# Patient Record
Sex: Female | Born: 1950
Health system: Southern US, Community
[De-identification: ages and names within clinical notes are randomized; demographics above are authoritative.]

## PROBLEM LIST (undated history)

## (undated) DIAGNOSIS — Z853 Personal history of malignant neoplasm of breast: Secondary | ICD-10-CM

## (undated) DIAGNOSIS — Z923 Personal history of irradiation: Secondary | ICD-10-CM

## (undated) DIAGNOSIS — K219 Gastro-esophageal reflux disease without esophagitis: Secondary | ICD-10-CM

## (undated) DIAGNOSIS — D0512 Intraductal carcinoma in situ of left breast: Secondary | ICD-10-CM

## (undated) DIAGNOSIS — K759 Inflammatory liver disease, unspecified: Secondary | ICD-10-CM

## (undated) HISTORY — DX: Personal history of malignant neoplasm of breast: Z85.3

## (undated) HISTORY — PX: TUBAL LIGATION: SHX77

## (undated) HISTORY — PX: BREAST BIOPSY: SHX20

## (undated) HISTORY — DX: Gastro-esophageal reflux disease without esophagitis: K21.9

## (undated) HISTORY — PX: PARTIAL COLECTOMY: SHX5273

## (undated) HISTORY — DX: Inflammatory liver disease, unspecified: K75.9

## (undated) HISTORY — DX: Intraductal carcinoma in situ of left breast: D05.12

## (undated) HISTORY — PX: CHOLECYSTECTOMY: SHX55

---

## 1996-02-19 DIAGNOSIS — C50919 Malignant neoplasm of unspecified site of unspecified female breast: Secondary | ICD-10-CM

## 1996-02-19 HISTORY — PX: MASTECTOMY: SHX3

## 1996-02-19 HISTORY — DX: Malignant neoplasm of unspecified site of unspecified female breast: C50.919

## 1999-03-19 ENCOUNTER — Encounter: Admission: RE | Admit: 1999-03-19 | Discharge: 1999-03-19 | Payer: Self-pay | Admitting: Obstetrics and Gynecology

## 1999-03-19 ENCOUNTER — Encounter: Payer: Self-pay | Admitting: Obstetrics and Gynecology

## 1999-08-07 ENCOUNTER — Other Ambulatory Visit: Admission: RE | Admit: 1999-08-07 | Discharge: 1999-08-07 | Payer: Self-pay | Admitting: Plastic Surgery

## 2000-03-19 ENCOUNTER — Encounter: Admission: RE | Admit: 2000-03-19 | Discharge: 2000-03-19 | Payer: Self-pay | Admitting: Obstetrics and Gynecology

## 2000-03-19 ENCOUNTER — Encounter: Payer: Self-pay | Admitting: Obstetrics and Gynecology

## 2000-05-10 ENCOUNTER — Emergency Department (HOSPITAL_COMMUNITY): Admission: EM | Admit: 2000-05-10 | Discharge: 2000-05-10 | Payer: Self-pay | Admitting: Emergency Medicine

## 2001-04-27 ENCOUNTER — Encounter: Payer: Self-pay | Admitting: Obstetrics and Gynecology

## 2001-04-27 ENCOUNTER — Encounter: Admission: RE | Admit: 2001-04-27 | Discharge: 2001-04-27 | Payer: Self-pay | Admitting: Obstetrics and Gynecology

## 2001-09-29 ENCOUNTER — Encounter: Payer: Self-pay | Admitting: Plastic Surgery

## 2001-09-29 ENCOUNTER — Encounter: Admission: RE | Admit: 2001-09-29 | Discharge: 2001-09-29 | Payer: Self-pay | Admitting: Plastic Surgery

## 2003-01-31 ENCOUNTER — Encounter: Admission: RE | Admit: 2003-01-31 | Discharge: 2003-01-31 | Payer: Self-pay | Admitting: Obstetrics and Gynecology

## 2003-02-10 ENCOUNTER — Encounter: Admission: RE | Admit: 2003-02-10 | Discharge: 2003-02-10 | Payer: Self-pay | Admitting: Obstetrics and Gynecology

## 2003-03-03 ENCOUNTER — Encounter (INDEPENDENT_AMBULATORY_CARE_PROVIDER_SITE_OTHER): Payer: Self-pay | Admitting: *Deleted

## 2003-03-03 ENCOUNTER — Encounter: Admission: RE | Admit: 2003-03-03 | Discharge: 2003-03-03 | Payer: Self-pay | Admitting: General Surgery

## 2003-08-25 ENCOUNTER — Encounter: Admission: RE | Admit: 2003-08-25 | Discharge: 2003-08-25 | Payer: Self-pay | Admitting: Obstetrics and Gynecology

## 2004-02-10 ENCOUNTER — Encounter: Admission: RE | Admit: 2004-02-10 | Discharge: 2004-02-10 | Payer: Self-pay | Admitting: Obstetrics and Gynecology

## 2004-05-23 ENCOUNTER — Encounter: Admission: RE | Admit: 2004-05-23 | Discharge: 2004-05-23 | Payer: Self-pay | Admitting: Obstetrics and Gynecology

## 2004-05-24 ENCOUNTER — Encounter: Admission: RE | Admit: 2004-05-24 | Discharge: 2004-05-24 | Payer: Self-pay | Admitting: Obstetrics and Gynecology

## 2004-06-27 ENCOUNTER — Encounter (INDEPENDENT_AMBULATORY_CARE_PROVIDER_SITE_OTHER): Payer: Self-pay | Admitting: Specialist

## 2004-06-27 ENCOUNTER — Ambulatory Visit (HOSPITAL_COMMUNITY): Admission: RE | Admit: 2004-06-27 | Discharge: 2004-06-27 | Payer: Self-pay | Admitting: Gastroenterology

## 2004-07-24 ENCOUNTER — Encounter (INDEPENDENT_AMBULATORY_CARE_PROVIDER_SITE_OTHER): Payer: Self-pay | Admitting: Specialist

## 2004-07-24 ENCOUNTER — Inpatient Hospital Stay (HOSPITAL_COMMUNITY): Admission: RE | Admit: 2004-07-24 | Discharge: 2004-07-27 | Payer: Self-pay | Admitting: General Surgery

## 2004-11-16 ENCOUNTER — Encounter: Admission: RE | Admit: 2004-11-16 | Discharge: 2004-11-16 | Payer: Self-pay | Admitting: Obstetrics and Gynecology

## 2005-04-25 ENCOUNTER — Encounter: Admission: RE | Admit: 2005-04-25 | Discharge: 2005-04-25 | Payer: Self-pay | Admitting: Obstetrics and Gynecology

## 2005-07-29 ENCOUNTER — Encounter (INDEPENDENT_AMBULATORY_CARE_PROVIDER_SITE_OTHER): Payer: Self-pay | Admitting: Specialist

## 2005-07-29 ENCOUNTER — Ambulatory Visit (HOSPITAL_COMMUNITY): Admission: RE | Admit: 2005-07-29 | Discharge: 2005-07-29 | Payer: Self-pay | Admitting: Gastroenterology

## 2006-04-28 ENCOUNTER — Encounter: Admission: RE | Admit: 2006-04-28 | Discharge: 2006-04-28 | Payer: Self-pay | Admitting: Obstetrics and Gynecology

## 2007-04-28 ENCOUNTER — Encounter: Admission: RE | Admit: 2007-04-28 | Discharge: 2007-04-28 | Payer: Self-pay | Admitting: Obstetrics and Gynecology

## 2007-04-29 ENCOUNTER — Encounter: Admission: RE | Admit: 2007-04-29 | Discharge: 2007-04-29 | Payer: Self-pay | Admitting: Obstetrics and Gynecology

## 2008-04-29 ENCOUNTER — Encounter: Admission: RE | Admit: 2008-04-29 | Discharge: 2008-04-29 | Payer: Self-pay | Admitting: Obstetrics and Gynecology

## 2008-07-12 ENCOUNTER — Encounter: Admission: RE | Admit: 2008-07-12 | Discharge: 2008-07-12 | Payer: Self-pay | Admitting: Family Medicine

## 2009-05-22 ENCOUNTER — Encounter: Admission: RE | Admit: 2009-05-22 | Discharge: 2009-05-22 | Payer: Self-pay | Admitting: Obstetrics and Gynecology

## 2010-03-11 ENCOUNTER — Encounter: Payer: Self-pay | Admitting: Gastroenterology

## 2010-06-15 ENCOUNTER — Other Ambulatory Visit: Payer: Self-pay | Admitting: Family Medicine

## 2010-06-15 DIAGNOSIS — Z1231 Encounter for screening mammogram for malignant neoplasm of breast: Secondary | ICD-10-CM

## 2010-06-25 ENCOUNTER — Ambulatory Visit
Admission: RE | Admit: 2010-06-25 | Discharge: 2010-06-25 | Disposition: A | Discharge status: Home / Self Care | Payer: BC Managed Care – PPO | Source: Ambulatory Visit | Attending: Family Medicine | Admitting: Family Medicine

## 2010-06-25 DIAGNOSIS — Z1231 Encounter for screening mammogram for malignant neoplasm of breast: Secondary | ICD-10-CM

## 2010-07-06 NOTE — Discharge Summary (Signed)
[name], Ariel Johnson             ACCOUNT NO.:  [id number]   MEDICAL RECORD NO.:  [id number]          PATIENT TYPE:  INP   LOCATION:  1613                         FACILITY:  [facility]   PHYSICIAN:  Sharlet Salina T. Hoxworth, M.D.DATE OF BIRTH:  [birth year]   DATE OF ADMISSION:  07/24/2004  DATE OF DISCHARGE:  07/27/2004                                 DISCHARGE SUMMARY   DISCHARGE DIAGNOSIS:  Tubulovillous adenoma, right colon.   OPERATION PERFORMED:  Laparoscopic right colectomy, July 24, 2004.   HISTORY OF PRESENT ILLNESS:  Ariel Johnson is a 60 year old female who recently  underwent screening colonoscopy with Dr. Charna Elizabeth, which revealed a  large, flat cecal mass.  Biopsies showed the fragments of tubulovillous  adenoma.  She has no GI symptoms.  After discussion of options, we elected  to proceed with laparoscopic right colectomy.  She is admitted for this  procedure following mechanical antibiotic bowel prep at home.   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  She had an open cholecystectomy in 1988.  She is followed for a suddenly abnormal mammogram with MRI of the right  breast with a six month followup planned.   CURRENT MEDICATIONS:  None.   ALLERGIES:  SULFA antibiotics.   SOCIAL HISTORY:  See the H&P.   FAMILY HISTORY:  See the H&P.   REVIEW OF SYSTEMS:  See the H&P.   PERTINENT PHYSICAL EXAMINATION:  VITAL SIGNS:  Afebrile.  Vital signs within  normal limits.  GENERAL:  A healthy-appearing female in no acute distress.  ABDOMEN:  Physical examination was unremarkable with well-healed abdominal  incision secondary to her tram flap and cholecystectomy.   HOSPITAL COURSE:  Patient was admitted on the morning of her procedure and  underwent an uneventful microscopic-assisted right hemicolectomy.  She had  some low-grade fever on the first postoperative day that improved with a  pulmonary toilet.  She was  started on clear liquids on the second  postoperative day and had some flatus and small bowel movements.  On the  third postoperative day, she was afebrile with normal vital signs.  She was  tolerating diet well.  Abdomen was soft and nontender.  White count was 5000  with hemoglobin of 10.3.  At this point, she is discharged home on the third  postop day.  Pathology showed tubulovillous adenoma with no evidence of  malignancy.   Followup is to be in my office in one week.   DISCHARGE MEDICATIONS:  Same as on admission plus Vicodin or Tylenol p.r.n.  pain.       BTH/MEDQ  D:  08/13/2004  T:  08/13/2004  Job:  [id number]   cc:   Anselmo Rod, M.D.  [address]  [address]  [place]  [place] [address]  Fax: [phone]   Evelena Peat, M.D.  [address]  [place]  [place] [address]  Fax: [phone]

## 2010-07-06 NOTE — Op Note (Signed)
NAMEHOORIA, GASPARINI             ACCOUNT NO.:  [id number]   MEDICAL RECORD NO.:  [id number]          PATIENT TYPE:  AMB   LOCATION:  ENDO                         FACILITY:  [facility]   PHYSICIAN:  Anselmo Rod, M.D.  DATE OF BIRTH:  08/25/1950   DATE OF PROCEDURE:  07/29/2005  DATE OF DISCHARGE:                                 OPERATIVE REPORT   PROCEDURE PERFORMED:  Colonoscopy with multiple cold biopsies.   ENDOSCOPIST:  Anselmo Rod, M.D.   INSTRUMENT USED:  Olympus video colonoscope.   INDICATIONS FOR PROCEDURE:  A 60 year old white female with a personal  history of breast cancer, status post right hemicolectomy for a large  tubulovillous adenoma, undergoing a repeat colonoscopy for surveillance  __________ recurrent polyps, masses, etc.   PRE-PROCEDURE PREPARATION:  Informed consent was procured from the patient.  The patient was fasted for four hours prior to the procedure and prepped  with OsmoPrep over the night and in the morning of the procedure.  Risks and  benefits of the procedure, including a 10% missed rate of cancer and polyps,  were discussed with the patient as well.   PRE-PROCEDURE PHYSICAL EXAMINATION:  VITAL SIGNS:  Stable vital signs.  NECK:  Supple.  CHEST:  Clear to auscultation.  HEART:  S1 and S2.  Regular.  ABDOMEN:  Soft with normal bowel sounds.   DESCRIPTION OF PROCEDURE:  The patient was placed in the left lateral  decubitus position and sedated with 75 mcg of fentanyl and 7 mg of Versed in  slow, incremental doses.  Once the patient was adequately sedated,  maintained on low flow oxygen, and continuous cardiac monitoring, the  Olympus video colonoscope was advanced from the rectum to the anastomosis  without difficulty.  Four small sessile polyps were biopsied from the  rectosigmoid colon.  Patient is status post right hemicolectomy.  Healthy  anastomosis was noted.  With retroflexion, the rectum revealed small  internal hemorrhoids.  The  patient tolerated the procedure well without  complications.   IMPRESSION:  1.Small nonbleeding internal hemorrhoid.  2.Healthy anastomosis, status post right hemicolectomy.  3.Four small sessile polyps were biopsied from the rectosigmoid colon.   RECOMMENDATIONS:  1.Await pathology results.  2.Avoid all nonsteroidals, including aspirin for the next two weeks.  3.Repeat colonoscopy depending on pathology results.  3.Outpatient followup as the needs arise in the future.      Anselmo Rod, M.D.  Electronically Signed     JNM/MEDQ  D:  07/29/2005  T:  07/29/2005  Job:  [id number]   cc:   Evelena Peat, M.D.  Fax: [phone]   Lorne Skeens. Hoxworth, M.D.  [address], [address]  [place]  [place] [address]

## 2010-07-06 NOTE — Op Note (Signed)
[name], [name]             ACCOUNT NO.:  [id number]   MEDICAL RECORD NO.:  [id number]          PATIENT TYPE:  INP   LOCATION:  0001                         FACILITY:  [facility]   PHYSICIAN:  Ariel Johnson, M.D.DATE OF BIRTH:  Feb 13, 1951   DATE OF PROCEDURE:  07/24/2004  DATE OF DISCHARGE:                                 OPERATIVE REPORT   PREOPERATIVE DIAGNOSES:  Villous adenoma of the cecum.   POSTOPERATIVE DIAGNOSES:  Villous adenoma of the cecum.   SURGICAL PROCEDURES:  Laparoscopic assisted right hemicolectomy.   SURGEON:  Ariel Johnson. Johnson, M.D.   ASSISTANT:  Ariel Johnson, M.D.   ANESTHESIA:  General.   BRIEF HISTORY:  Ariel Johnson is a 60 year old female with a personal  history of breast cancer status post left mastectomy followed by TRAM flap  reconstruction. She recently underwent a routine screening colonoscopy and  was found to have a fairly large sessile adenomatous appearing lesion in the  cecum. Biopsy has revealed villous adenoma without atypia. Right  hemicolectomy has been recommended for treatment. A laparoscopic approach  has been discussed and accepted. The nature of the procedure, its  indications, risks of bleeding, infection and anastomotic leak and possible  need for open surgery were discussed and understood. She is now brought to  the operating room for this procedure.   DESCRIPTION OF PROCEDURE:  The patient was brought to the operating room and  placed in supine position on the operating table and general endotracheal  anesthesia was induced. The abdomen was widely sterilely prepped and draped.  Foley catheter was placed. She received IV preoperative antibiotics. She had  undergone a mechanical antibiotic bowel prep at home. The previous  periumbilical incision for the TRAM flap was used and dissection carried  down through the subcutaneous tissue. The mesh was sharply incised for about  a centimeter, the posterior peritoneum  elevated and the free peritoneal  cavity entered under direct vision. Through a mattress suture of #0 Vicryl,  the Hasson trocar was placed and pneumoperitoneum established. Video  laparoscopy revealed fortunately minimal adhesions from a previous  cholecystectomy. Under direct vision, a 5 mm trocar was placed in the  suprapubic area and in the epigastrium. The cecum was already quite mobile.  The terminal ileum was mobile. The bowel, liver, and retroperitoneum all  appeared normal. The right colon was then extensively mobilized dividing the  peritoneum along the line of Toldt with scissor and harmonic scalpel  dissection. There were a few adhesions to the right upper quadrant  cholecystectomy incision which were taken down. The right colon was  mobilized up toward the hepatic flexure. The liver was retracted with an  additional 5 mm trocar placed in the right lower quadrant through her  previous TRAM incision. The hepatic flexure was mobilized dividing the  hepatocolic ligament with the harmonic scalpel and the colon was mobilized  to the proximal transverse colon. Dissection was then carried back  proximally along the right colon further mobilizing the colon out of the  retroperitoneum. The right ureter was identified and carefully protected. A  few further peritoneal attachments to  the cecum were divided which was very  mobile and at this point the entire right colon was very well mobilized. At  this point, a 6 cm incision was made through her old TRAM incision in the  right mid abdomen incorporating the one trocar site and dissection carried  down through the fascial muscle layers with cautery. A wound protractor was  placed. The right colon was brought up through the small incision and was  very mobile and was brought up to the proximal transverse colon without any  difficulty exposing the mesentery. Points of resection at the terminal ileum  and proximal transverse colon were chosen.  The mesentery of the segment to  be resected was then sequentially dived between clamps and tied with 2-0  silk ties. The ileocolic and right colic vessels were taken near the origin  and additionally suture ligated. After complete division of the mesentery, a  functional end-to-end anastomosis was created with a single firing of the  GIA 75 mm stapler. The staple line was intact and without bleeding. The  common enterotomy was closed and the specimen was removed with a single  firing of TA 60 stapler. The specimen was opened in the operating room and  the lesion was seen to be within the cecum as described. The viscera  returned to the abdomen. The right lower quadrant decision was closed in two  layers with running 0 and #1 PDS. Following this, the pneumoperitoneum was  reestablished and the laparoscopy showed no evidence of bleeding. The  abdomen was copiously irrigated with saline. The mesenteric defect had been  closed with an interrupted 2-0 silk sutures prior to placing the colon back  in the abdomen. After hemostasis was assured and the abdomen was well  irrigated, trocars were removed and all CO2 evacuated. Skin incisions were  closed with staples. Sponge, needle and instrument counts were correct. Dry  sterile dressings were applied and the patient taken to recovery in good  condition.      BTH/MEDQ  D:  07/24/2004  T:  07/24/2004  Job:  [id number]

## 2010-07-06 NOTE — Op Note (Signed)
Ariel Johnson, Ariel Johnson             ACCOUNT NO.:  [id number]   MEDICAL RECORD NO.:  [id number]          PATIENT TYPE:  AMB   LOCATION:  ENDO                         FACILITY:  [facility]   PHYSICIAN:  Anselmo Rod, M.D.  DATE OF BIRTH:  [birth year]   DATE OF PROCEDURE:  06/27/2004  DATE OF DISCHARGE:                                 OPERATIVE REPORT   PROCEDURE PERFORMED:  Colonoscopy with multiple cold biopsies.   ENDOSCOPIST:  Charna Elizabeth, M.D.   INSTRUMENT USED:  Olympus video colonoscope.   INDICATIONS FOR PROCEDURE:  The patient is a 60 year old white female with a  personal history of breast cancer diagnosed and treated in 1998 and a family  history of colon cancer (father had a partial colectomy for cancerous  polyp).  Undergoing screening colonoscopy. The patient has a history of  occasional constipation and at times rectal bleeding.   PREPROCEDURE PREPARATION:  Informed consent was procured from the patient.  The patient was fasted for eight hours prior to the procedure and prepped  with a bottle of magnesium citrate and a gallon of GoLYTELY the night prior  to the procedure.  The risks and benefits of the procedure including a 10%  miss rate for polyps or cancers was discussed with the patient as well.   PREPROCEDURE PHYSICAL:  The patient had stable vital signs.  Neck supple.  Chest clear to auscultation.  S1 and S2 regular.  Abdomen soft with normal  bowel sounds.   DESCRIPTION OF PROCEDURE:  The patient was placed in left lateral decubitus  position and sedated with 70 mg of Demerol and 7 mg of Versed in slow  incremental doses.  Once the patient was adequately sedated and maintained  on low flow oxygen and continuous cardiac monitoring, the Olympus video  colonoscope was advanced from the rectum to the cecum.  Two small sessile  polyps were biopsied from the cecal base. A large flat lesion was seen in  the cecum, opposite the ileocecal valve resembling a villous  adenoma.  Multiple biopsies were done as the lesion could not be completely excised.  No attempt was made to do so.  Small internal hemorrhoids were seen on  retroflexion.  The patient tolerated the procedure well without  complication.   IMPRESSION:  1.  Flat large cecal mass noted.  Multiple biopsies done.  2.  Two small sessile polyps biopsied from the cecal base.  3.  Small internal hemorrhoids.   RECOMMENDATIONS:  1.  Await pathology results.  2.  Avoid all nonsteroidals including aspirin for now.  3.  Check CEA levels.  4.  CT scan of the abdomen and pelvis today.  5.  Surgical evaluation with Sharlet Salina T. Hoxworth, M.D.  6.  Outpatient followup in the next two weeks or earlier if need be.      JNM/MEDQ  D:  06/27/2004  T:  06/28/2004  Job:  [id number]   cc:   Evelena Peat, M.D.  [address]  [place]  [place] [address]  Fax: [phone]   Lorne Skeens. Hoxworth, M.D.  [phone]  [place]  [place] [address]

## 2011-07-08 ENCOUNTER — Other Ambulatory Visit: Payer: Self-pay | Admitting: Internal Medicine

## 2011-07-08 DIAGNOSIS — Z1231 Encounter for screening mammogram for malignant neoplasm of breast: Secondary | ICD-10-CM

## 2011-07-25 ENCOUNTER — Ambulatory Visit
Admission: RE | Admit: 2011-07-25 | Discharge: 2011-07-25 | Disposition: A | Discharge status: Home / Self Care | Payer: BC Managed Care – PPO | Source: Ambulatory Visit | Attending: Internal Medicine | Admitting: Internal Medicine

## 2011-07-25 DIAGNOSIS — Z1231 Encounter for screening mammogram for malignant neoplasm of breast: Secondary | ICD-10-CM

## 2011-08-01 ENCOUNTER — Other Ambulatory Visit: Payer: Self-pay | Admitting: Internal Medicine

## 2011-08-01 DIAGNOSIS — R928 Other abnormal and inconclusive findings on diagnostic imaging of breast: Secondary | ICD-10-CM

## 2011-08-06 ENCOUNTER — Ambulatory Visit
Admission: RE | Admit: 2011-08-06 | Discharge: 2011-08-06 | Disposition: A | Discharge status: Home / Self Care | Payer: BC Managed Care – PPO | Source: Ambulatory Visit | Attending: Internal Medicine | Admitting: Internal Medicine

## 2011-08-06 ENCOUNTER — Other Ambulatory Visit: Payer: Self-pay | Admitting: Internal Medicine

## 2011-08-06 DIAGNOSIS — R928 Other abnormal and inconclusive findings on diagnostic imaging of breast: Secondary | ICD-10-CM

## 2011-08-06 DIAGNOSIS — R921 Mammographic calcification found on diagnostic imaging of breast: Secondary | ICD-10-CM

## 2011-08-08 ENCOUNTER — Ambulatory Visit
Admission: RE | Admit: 2011-08-08 | Discharge: 2011-08-08 | Disposition: A | Discharge status: Home / Self Care | Payer: BC Managed Care – PPO | Source: Ambulatory Visit | Attending: Internal Medicine | Admitting: Internal Medicine

## 2011-08-08 DIAGNOSIS — R921 Mammographic calcification found on diagnostic imaging of breast: Secondary | ICD-10-CM

## 2011-09-27 ENCOUNTER — Other Ambulatory Visit: Payer: Self-pay | Admitting: Dermatology

## 2012-06-25 ENCOUNTER — Other Ambulatory Visit: Payer: Self-pay

## 2012-06-25 DIAGNOSIS — Z9012 Acquired absence of left breast and nipple: Secondary | ICD-10-CM

## 2012-06-25 DIAGNOSIS — Z1231 Encounter for screening mammogram for malignant neoplasm of breast: Secondary | ICD-10-CM

## 2012-07-29 ENCOUNTER — Ambulatory Visit
Admission: RE | Admit: 2012-07-29 | Discharge: 2012-07-29 | Disposition: A | Discharge status: Home / Self Care | Payer: BC Managed Care – PPO | Source: Ambulatory Visit

## 2012-07-29 DIAGNOSIS — Z1231 Encounter for screening mammogram for malignant neoplasm of breast: Secondary | ICD-10-CM

## 2012-07-29 DIAGNOSIS — Z9012 Acquired absence of left breast and nipple: Secondary | ICD-10-CM

## 2012-12-24 ENCOUNTER — Other Ambulatory Visit (HOSPITAL_COMMUNITY)
Admission: RE | Admit: 2012-12-24 | Discharge: 2012-12-24 | Disposition: A | Discharge status: Home / Self Care | Payer: BC Managed Care – PPO | Source: Ambulatory Visit | Attending: Internal Medicine | Admitting: Internal Medicine

## 2012-12-24 ENCOUNTER — Other Ambulatory Visit: Payer: Self-pay | Admitting: Internal Medicine

## 2012-12-24 DIAGNOSIS — Z1151 Encounter for screening for human papillomavirus (HPV): Secondary | ICD-10-CM | POA: Insufficient documentation

## 2012-12-24 DIAGNOSIS — Z01419 Encounter for gynecological examination (general) (routine) without abnormal findings: Secondary | ICD-10-CM | POA: Insufficient documentation

## 2013-07-08 ENCOUNTER — Other Ambulatory Visit: Payer: Self-pay

## 2013-07-08 DIAGNOSIS — Z1231 Encounter for screening mammogram for malignant neoplasm of breast: Secondary | ICD-10-CM

## 2013-07-08 DIAGNOSIS — Z9012 Acquired absence of left breast and nipple: Secondary | ICD-10-CM

## 2013-08-02 ENCOUNTER — Ambulatory Visit: Payer: BC Managed Care – PPO

## 2013-08-19 ENCOUNTER — Ambulatory Visit
Admission: RE | Admit: 2013-08-19 | Discharge: 2013-08-19 | Disposition: A | Discharge status: Home / Self Care | Payer: BC Managed Care – PPO | Source: Ambulatory Visit

## 2013-08-19 DIAGNOSIS — Z1231 Encounter for screening mammogram for malignant neoplasm of breast: Secondary | ICD-10-CM

## 2013-08-19 DIAGNOSIS — Z9012 Acquired absence of left breast and nipple: Secondary | ICD-10-CM

## 2014-03-18 ENCOUNTER — Other Ambulatory Visit: Payer: Self-pay | Admitting: Internal Medicine

## 2014-03-18 DIAGNOSIS — N63 Unspecified lump in unspecified breast: Secondary | ICD-10-CM

## 2014-03-25 ENCOUNTER — Ambulatory Visit
Admission: RE | Admit: 2014-03-25 | Discharge: 2014-03-25 | Disposition: A | Discharge status: Home / Self Care | Payer: Self-pay | Source: Ambulatory Visit | Attending: Internal Medicine | Admitting: Internal Medicine

## 2014-03-25 DIAGNOSIS — N63 Unspecified lump in unspecified breast: Secondary | ICD-10-CM

## 2014-08-02 ENCOUNTER — Other Ambulatory Visit: Payer: Self-pay

## 2014-08-02 DIAGNOSIS — Z1231 Encounter for screening mammogram for malignant neoplasm of breast: Secondary | ICD-10-CM

## 2014-08-02 DIAGNOSIS — Z9012 Acquired absence of left breast and nipple: Secondary | ICD-10-CM

## 2014-09-09 ENCOUNTER — Ambulatory Visit
Admission: RE | Admit: 2014-09-09 | Discharge: 2014-09-09 | Disposition: A | Discharge status: Home / Self Care | Payer: BLUE CROSS/BLUE SHIELD | Source: Ambulatory Visit

## 2014-09-09 DIAGNOSIS — Z9012 Acquired absence of left breast and nipple: Secondary | ICD-10-CM

## 2014-09-09 DIAGNOSIS — Z1231 Encounter for screening mammogram for malignant neoplasm of breast: Secondary | ICD-10-CM

## 2015-08-29 ENCOUNTER — Other Ambulatory Visit: Payer: Self-pay | Admitting: Internal Medicine

## 2015-08-29 DIAGNOSIS — N6320 Unspecified lump in the left breast, unspecified quadrant: Secondary | ICD-10-CM

## 2015-08-29 DIAGNOSIS — Z9012 Acquired absence of left breast and nipple: Secondary | ICD-10-CM

## 2015-09-04 ENCOUNTER — Other Ambulatory Visit: Payer: Self-pay | Admitting: Internal Medicine

## 2015-09-04 DIAGNOSIS — Z9012 Acquired absence of left breast and nipple: Secondary | ICD-10-CM

## 2015-09-11 ENCOUNTER — Ambulatory Visit
Admission: RE | Admit: 2015-09-11 | Discharge: 2015-09-11 | Disposition: A | Discharge status: Home / Self Care | Payer: BLUE CROSS/BLUE SHIELD | Source: Ambulatory Visit | Attending: Internal Medicine | Admitting: Internal Medicine

## 2015-09-11 DIAGNOSIS — Z9012 Acquired absence of left breast and nipple: Secondary | ICD-10-CM

## 2015-12-26 DIAGNOSIS — L308 Other specified dermatitis: Secondary | ICD-10-CM | POA: Diagnosis not present

## 2015-12-26 DIAGNOSIS — L821 Other seborrheic keratosis: Secondary | ICD-10-CM | POA: Diagnosis not present

## 2015-12-26 DIAGNOSIS — D1801 Hemangioma of skin and subcutaneous tissue: Secondary | ICD-10-CM | POA: Diagnosis not present

## 2015-12-26 DIAGNOSIS — D225 Melanocytic nevi of trunk: Secondary | ICD-10-CM | POA: Diagnosis not present

## 2015-12-26 DIAGNOSIS — I788 Other diseases of capillaries: Secondary | ICD-10-CM | POA: Diagnosis not present

## 2015-12-26 DIAGNOSIS — D2272 Melanocytic nevi of left lower limb, including hip: Secondary | ICD-10-CM | POA: Diagnosis not present

## 2016-01-01 ENCOUNTER — Other Ambulatory Visit (HOSPITAL_COMMUNITY)
Admission: RE | Admit: 2016-01-01 | Discharge: 2016-01-01 | Disposition: A | Discharge status: Home / Self Care | Payer: PPO | Source: Ambulatory Visit | Attending: Internal Medicine | Admitting: Internal Medicine

## 2016-01-01 ENCOUNTER — Other Ambulatory Visit: Payer: Self-pay | Admitting: Internal Medicine

## 2016-01-01 DIAGNOSIS — K219 Gastro-esophageal reflux disease without esophagitis: Secondary | ICD-10-CM | POA: Diagnosis not present

## 2016-01-01 DIAGNOSIS — Z01419 Encounter for gynecological examination (general) (routine) without abnormal findings: Secondary | ICD-10-CM | POA: Insufficient documentation

## 2016-01-01 DIAGNOSIS — Z1151 Encounter for screening for human papillomavirus (HPV): Secondary | ICD-10-CM | POA: Insufficient documentation

## 2016-01-01 DIAGNOSIS — Z1389 Encounter for screening for other disorder: Secondary | ICD-10-CM | POA: Diagnosis not present

## 2016-01-01 DIAGNOSIS — Z0000 Encounter for general adult medical examination without abnormal findings: Secondary | ICD-10-CM | POA: Diagnosis not present

## 2016-01-04 LAB — CYTOLOGY - PAP
DIAGNOSIS: NEGATIVE
HPV: NOT DETECTED

## 2016-03-13 DIAGNOSIS — H04123 Dry eye syndrome of bilateral lacrimal glands: Secondary | ICD-10-CM | POA: Diagnosis not present

## 2016-03-13 DIAGNOSIS — H2513 Age-related nuclear cataract, bilateral: Secondary | ICD-10-CM | POA: Diagnosis not present

## 2016-08-12 ENCOUNTER — Other Ambulatory Visit: Payer: Self-pay | Admitting: Internal Medicine

## 2016-08-12 DIAGNOSIS — Z1231 Encounter for screening mammogram for malignant neoplasm of breast: Secondary | ICD-10-CM

## 2016-09-16 DIAGNOSIS — M25561 Pain in right knee: Secondary | ICD-10-CM | POA: Diagnosis not present

## 2016-09-16 DIAGNOSIS — S83242A Other tear of medial meniscus, current injury, left knee, initial encounter: Secondary | ICD-10-CM | POA: Diagnosis not present

## 2016-09-16 DIAGNOSIS — M545 Low back pain: Secondary | ICD-10-CM | POA: Diagnosis not present

## 2016-09-20 ENCOUNTER — Ambulatory Visit
Admission: RE | Admit: 2016-09-20 | Discharge: 2016-09-20 | Disposition: A | Discharge status: Home / Self Care | Payer: PPO | Source: Ambulatory Visit | Attending: Internal Medicine | Admitting: Internal Medicine

## 2016-09-20 DIAGNOSIS — Z1231 Encounter for screening mammogram for malignant neoplasm of breast: Secondary | ICD-10-CM | POA: Diagnosis not present

## 2016-12-31 DIAGNOSIS — D225 Melanocytic nevi of trunk: Secondary | ICD-10-CM | POA: Diagnosis not present

## 2016-12-31 DIAGNOSIS — D2261 Melanocytic nevi of right upper limb, including shoulder: Secondary | ICD-10-CM | POA: Diagnosis not present

## 2016-12-31 DIAGNOSIS — L918 Other hypertrophic disorders of the skin: Secondary | ICD-10-CM | POA: Diagnosis not present

## 2016-12-31 DIAGNOSIS — L720 Epidermal cyst: Secondary | ICD-10-CM | POA: Diagnosis not present

## 2016-12-31 DIAGNOSIS — L821 Other seborrheic keratosis: Secondary | ICD-10-CM | POA: Diagnosis not present

## 2016-12-31 DIAGNOSIS — L814 Other melanin hyperpigmentation: Secondary | ICD-10-CM | POA: Diagnosis not present

## 2016-12-31 DIAGNOSIS — L578 Other skin changes due to chronic exposure to nonionizing radiation: Secondary | ICD-10-CM | POA: Diagnosis not present

## 2016-12-31 DIAGNOSIS — L218 Other seborrheic dermatitis: Secondary | ICD-10-CM | POA: Diagnosis not present

## 2016-12-31 DIAGNOSIS — D2272 Melanocytic nevi of left lower limb, including hip: Secondary | ICD-10-CM | POA: Diagnosis not present

## 2017-01-02 DIAGNOSIS — Z23 Encounter for immunization: Secondary | ICD-10-CM | POA: Diagnosis not present

## 2017-01-02 DIAGNOSIS — Z1389 Encounter for screening for other disorder: Secondary | ICD-10-CM | POA: Diagnosis not present

## 2017-01-02 DIAGNOSIS — Z0000 Encounter for general adult medical examination without abnormal findings: Secondary | ICD-10-CM | POA: Diagnosis not present

## 2017-04-29 DIAGNOSIS — J329 Chronic sinusitis, unspecified: Secondary | ICD-10-CM | POA: Diagnosis not present

## 2017-09-22 ENCOUNTER — Other Ambulatory Visit: Payer: Self-pay | Admitting: Internal Medicine

## 2017-09-22 DIAGNOSIS — Z1231 Encounter for screening mammogram for malignant neoplasm of breast: Secondary | ICD-10-CM

## 2017-10-22 ENCOUNTER — Ambulatory Visit
Admission: RE | Admit: 2017-10-22 | Discharge: 2017-10-22 | Disposition: A | Discharge status: Home / Self Care | Payer: PPO | Source: Ambulatory Visit | Attending: Internal Medicine | Admitting: Internal Medicine

## 2017-10-22 DIAGNOSIS — Z1231 Encounter for screening mammogram for malignant neoplasm of breast: Secondary | ICD-10-CM | POA: Diagnosis not present

## 2017-10-24 DIAGNOSIS — K649 Unspecified hemorrhoids: Secondary | ICD-10-CM | POA: Diagnosis not present

## 2017-12-31 DIAGNOSIS — L821 Other seborrheic keratosis: Secondary | ICD-10-CM | POA: Diagnosis not present

## 2017-12-31 DIAGNOSIS — D225 Melanocytic nevi of trunk: Secondary | ICD-10-CM | POA: Diagnosis not present

## 2017-12-31 DIAGNOSIS — D2262 Melanocytic nevi of left upper limb, including shoulder: Secondary | ICD-10-CM | POA: Diagnosis not present

## 2017-12-31 DIAGNOSIS — L814 Other melanin hyperpigmentation: Secondary | ICD-10-CM | POA: Diagnosis not present

## 2017-12-31 DIAGNOSIS — D2261 Melanocytic nevi of right upper limb, including shoulder: Secondary | ICD-10-CM | POA: Diagnosis not present

## 2017-12-31 DIAGNOSIS — D2272 Melanocytic nevi of left lower limb, including hip: Secondary | ICD-10-CM | POA: Diagnosis not present

## 2017-12-31 DIAGNOSIS — L570 Actinic keratosis: Secondary | ICD-10-CM | POA: Diagnosis not present

## 2018-01-29 DIAGNOSIS — M545 Low back pain: Secondary | ICD-10-CM | POA: Diagnosis not present

## 2018-01-29 DIAGNOSIS — M5442 Lumbago with sciatica, left side: Secondary | ICD-10-CM | POA: Diagnosis not present

## 2018-01-29 DIAGNOSIS — S39012D Strain of muscle, fascia and tendon of lower back, subsequent encounter: Secondary | ICD-10-CM | POA: Diagnosis not present

## 2018-01-29 DIAGNOSIS — M25562 Pain in left knee: Secondary | ICD-10-CM | POA: Diagnosis not present

## 2018-01-29 DIAGNOSIS — M5441 Lumbago with sciatica, right side: Secondary | ICD-10-CM | POA: Diagnosis not present

## 2018-01-29 DIAGNOSIS — M6281 Muscle weakness (generalized): Secondary | ICD-10-CM | POA: Diagnosis not present

## 2018-02-05 DIAGNOSIS — M6281 Muscle weakness (generalized): Secondary | ICD-10-CM | POA: Diagnosis not present

## 2018-02-05 DIAGNOSIS — S39012D Strain of muscle, fascia and tendon of lower back, subsequent encounter: Secondary | ICD-10-CM | POA: Diagnosis not present

## 2018-02-10 DIAGNOSIS — Z853 Personal history of malignant neoplasm of breast: Secondary | ICD-10-CM | POA: Diagnosis not present

## 2018-02-10 DIAGNOSIS — Z0000 Encounter for general adult medical examination without abnormal findings: Secondary | ICD-10-CM | POA: Diagnosis not present

## 2018-02-10 DIAGNOSIS — K219 Gastro-esophageal reflux disease without esophagitis: Secondary | ICD-10-CM | POA: Diagnosis not present

## 2018-02-10 DIAGNOSIS — Z1389 Encounter for screening for other disorder: Secondary | ICD-10-CM | POA: Diagnosis not present

## 2018-02-10 DIAGNOSIS — Z1159 Encounter for screening for other viral diseases: Secondary | ICD-10-CM | POA: Diagnosis not present

## 2018-02-10 DIAGNOSIS — K5909 Other constipation: Secondary | ICD-10-CM | POA: Diagnosis not present

## 2018-02-10 DIAGNOSIS — N898 Other specified noninflammatory disorders of vagina: Secondary | ICD-10-CM | POA: Diagnosis not present

## 2018-02-10 DIAGNOSIS — M8588 Other specified disorders of bone density and structure, other site: Secondary | ICD-10-CM | POA: Diagnosis not present

## 2018-02-12 DIAGNOSIS — M6281 Muscle weakness (generalized): Secondary | ICD-10-CM | POA: Diagnosis not present

## 2018-02-12 DIAGNOSIS — S39012D Strain of muscle, fascia and tendon of lower back, subsequent encounter: Secondary | ICD-10-CM | POA: Diagnosis not present

## 2018-02-19 DIAGNOSIS — S39012D Strain of muscle, fascia and tendon of lower back, subsequent encounter: Secondary | ICD-10-CM | POA: Diagnosis not present

## 2018-02-19 DIAGNOSIS — M6281 Muscle weakness (generalized): Secondary | ICD-10-CM | POA: Diagnosis not present

## 2018-02-24 DIAGNOSIS — S39012D Strain of muscle, fascia and tendon of lower back, subsequent encounter: Secondary | ICD-10-CM | POA: Diagnosis not present

## 2018-02-24 DIAGNOSIS — M6281 Muscle weakness (generalized): Secondary | ICD-10-CM | POA: Diagnosis not present

## 2018-02-27 DIAGNOSIS — L570 Actinic keratosis: Secondary | ICD-10-CM | POA: Diagnosis not present

## 2018-02-27 DIAGNOSIS — L720 Epidermal cyst: Secondary | ICD-10-CM | POA: Diagnosis not present

## 2018-02-27 DIAGNOSIS — L308 Other specified dermatitis: Secondary | ICD-10-CM | POA: Diagnosis not present

## 2018-02-27 DIAGNOSIS — L821 Other seborrheic keratosis: Secondary | ICD-10-CM | POA: Diagnosis not present

## 2018-03-23 DIAGNOSIS — J029 Acute pharyngitis, unspecified: Secondary | ICD-10-CM | POA: Diagnosis not present

## 2018-04-16 DIAGNOSIS — N951 Menopausal and female climacteric states: Secondary | ICD-10-CM | POA: Diagnosis not present

## 2018-04-16 DIAGNOSIS — N898 Other specified noninflammatory disorders of vagina: Secondary | ICD-10-CM | POA: Diagnosis not present

## 2018-04-24 DIAGNOSIS — J029 Acute pharyngitis, unspecified: Secondary | ICD-10-CM | POA: Diagnosis not present

## 2018-04-24 DIAGNOSIS — K219 Gastro-esophageal reflux disease without esophagitis: Secondary | ICD-10-CM | POA: Diagnosis not present

## 2018-06-16 DIAGNOSIS — N952 Postmenopausal atrophic vaginitis: Secondary | ICD-10-CM | POA: Diagnosis not present

## 2018-06-16 DIAGNOSIS — N951 Menopausal and female climacteric states: Secondary | ICD-10-CM | POA: Diagnosis not present

## 2018-06-16 DIAGNOSIS — Z853 Personal history of malignant neoplasm of breast: Secondary | ICD-10-CM | POA: Diagnosis not present

## 2018-09-15 ENCOUNTER — Other Ambulatory Visit: Payer: Self-pay | Admitting: Internal Medicine

## 2018-09-15 DIAGNOSIS — Z1231 Encounter for screening mammogram for malignant neoplasm of breast: Secondary | ICD-10-CM

## 2018-09-17 DIAGNOSIS — Z800 Family history of malignant neoplasm of digestive organs: Secondary | ICD-10-CM | POA: Diagnosis not present

## 2018-09-17 DIAGNOSIS — Z1211 Encounter for screening for malignant neoplasm of colon: Secondary | ICD-10-CM | POA: Diagnosis not present

## 2018-09-17 DIAGNOSIS — Z86010 Personal history of colonic polyps: Secondary | ICD-10-CM | POA: Diagnosis not present

## 2018-09-17 DIAGNOSIS — K5900 Constipation, unspecified: Secondary | ICD-10-CM | POA: Diagnosis not present

## 2018-09-17 DIAGNOSIS — K219 Gastro-esophageal reflux disease without esophagitis: Secondary | ICD-10-CM | POA: Diagnosis not present

## 2018-09-22 DIAGNOSIS — Z7689 Persons encountering health services in other specified circumstances: Secondary | ICD-10-CM | POA: Diagnosis not present

## 2018-10-21 DIAGNOSIS — K219 Gastro-esophageal reflux disease without esophagitis: Secondary | ICD-10-CM | POA: Diagnosis not present

## 2018-10-21 DIAGNOSIS — Z1211 Encounter for screening for malignant neoplasm of colon: Secondary | ICD-10-CM | POA: Diagnosis not present

## 2018-10-21 DIAGNOSIS — Z86010 Personal history of colonic polyps: Secondary | ICD-10-CM | POA: Diagnosis not present

## 2018-10-21 DIAGNOSIS — Z800 Family history of malignant neoplasm of digestive organs: Secondary | ICD-10-CM | POA: Diagnosis not present

## 2018-10-29 ENCOUNTER — Other Ambulatory Visit: Payer: Self-pay

## 2018-10-29 ENCOUNTER — Other Ambulatory Visit: Payer: Self-pay | Admitting: Internal Medicine

## 2018-10-29 ENCOUNTER — Ambulatory Visit
Admission: RE | Admit: 2018-10-29 | Discharge: 2018-10-29 | Disposition: A | Discharge status: Home / Self Care | Payer: PPO | Source: Ambulatory Visit | Attending: Internal Medicine | Admitting: Internal Medicine

## 2018-10-29 DIAGNOSIS — Z1231 Encounter for screening mammogram for malignant neoplasm of breast: Secondary | ICD-10-CM

## 2018-11-02 ENCOUNTER — Other Ambulatory Visit: Payer: Self-pay | Admitting: Internal Medicine

## 2018-11-02 DIAGNOSIS — R928 Other abnormal and inconclusive findings on diagnostic imaging of breast: Secondary | ICD-10-CM

## 2018-11-06 ENCOUNTER — Other Ambulatory Visit: Payer: Self-pay | Admitting: Internal Medicine

## 2018-11-06 ENCOUNTER — Other Ambulatory Visit: Payer: Self-pay

## 2018-11-06 ENCOUNTER — Ambulatory Visit
Admission: RE | Admit: 2018-11-06 | Discharge: 2018-11-06 | Disposition: A | Discharge status: Home / Self Care | Payer: PPO | Source: Ambulatory Visit | Attending: Internal Medicine | Admitting: Internal Medicine

## 2018-11-06 DIAGNOSIS — N6312 Unspecified lump in the right breast, upper inner quadrant: Secondary | ICD-10-CM | POA: Diagnosis not present

## 2018-11-06 DIAGNOSIS — R922 Inconclusive mammogram: Secondary | ICD-10-CM | POA: Diagnosis not present

## 2018-11-06 DIAGNOSIS — N6310 Unspecified lump in the right breast, unspecified quadrant: Secondary | ICD-10-CM

## 2018-11-06 DIAGNOSIS — R928 Other abnormal and inconclusive findings on diagnostic imaging of breast: Secondary | ICD-10-CM

## 2018-11-06 DIAGNOSIS — R921 Mammographic calcification found on diagnostic imaging of breast: Secondary | ICD-10-CM | POA: Diagnosis not present

## 2018-11-11 ENCOUNTER — Other Ambulatory Visit (HOSPITAL_COMMUNITY): Payer: Self-pay | Admitting: Diagnostic Radiology

## 2018-11-11 ENCOUNTER — Other Ambulatory Visit: Payer: Self-pay

## 2018-11-11 ENCOUNTER — Ambulatory Visit
Admission: RE | Admit: 2018-11-11 | Discharge: 2018-11-11 | Disposition: A | Discharge status: Home / Self Care | Payer: PPO | Source: Ambulatory Visit | Attending: Internal Medicine | Admitting: Internal Medicine

## 2018-11-11 DIAGNOSIS — C50211 Malignant neoplasm of upper-inner quadrant of right female breast: Secondary | ICD-10-CM | POA: Diagnosis not present

## 2018-11-11 DIAGNOSIS — N6310 Unspecified lump in the right breast, unspecified quadrant: Secondary | ICD-10-CM

## 2018-11-11 DIAGNOSIS — N6312 Unspecified lump in the right breast, upper inner quadrant: Secondary | ICD-10-CM | POA: Diagnosis not present

## 2018-11-12 ENCOUNTER — Encounter: Payer: Self-pay | Admitting: *Deleted

## 2018-11-13 ENCOUNTER — Telehealth: Payer: Self-pay | Admitting: Hematology and Oncology

## 2018-11-13 ENCOUNTER — Other Ambulatory Visit: Payer: Self-pay | Admitting: *Deleted

## 2018-11-13 DIAGNOSIS — Z170 Estrogen receptor positive status [ER+]: Secondary | ICD-10-CM

## 2018-11-13 DIAGNOSIS — C50211 Malignant neoplasm of upper-inner quadrant of right female breast: Secondary | ICD-10-CM | POA: Insufficient documentation

## 2018-11-13 NOTE — Telephone Encounter (Signed)
Error

## 2018-11-17 NOTE — Progress Notes (Signed)
Radiation Oncology         [phone] ________________________________  Initial outpatient Consultation  Name: Ariel Johnson MRN: [id number]  Date: 11/18/2018  DOB: 08-19-1950  [id number], Jenny Reichmann, MD  Alphonsa Overall, MD   REFERRING PHYSICIAN: Alphonsa Overall, MD  DIAGNOSIS:    ICD-10-CM   1. Malignant neoplasm of upper-inner quadrant of right breast in female, estrogen receptor positive ([place])  C50.211    Z17.0    Cancer Staging Malignant neoplasm of upper-inner quadrant of right breast in female, estrogen receptor positive ([place]) Staging form: Breast, AJCC 8th Edition - Clinical stage from 11/18/2018: Stage IB (cT2, cN0, cM0, G2, ER+, PR+, HER2-) - Signed by Nicholas Lose, MD on 11/18/2018   CHIEF COMPLAINT: Here to discuss management of right breast cancer  HISTORY OF PRESENT ILLNESS::[name] is a 68 y.o. female with a history of left breast cancer, s/p left mastectomy in 1998; she did not receive RT. More recently, she presented with breast abnormality on the following imaging: right breast screening mammogram on the date of 10/29/2018.  Symptoms, if any, at that time, were: none.   Ultrasound of breast on 11/06/2018 revealed a highly suspicious 2.6 cm right breast mass at 1 o'clock. Targeted ultrasound of the right axilla demonstrates no suspicious appearing lymph nodes. Biopsy on date of 11/11/2018 showed invasive ductal carcinoma and DCIS.  ER status: 95%; PR status 95%, Her2 status negative; Grade 2.  She reports that 5 to 10 years ago she underwent genetic testing and it was negative.    She is in her USOH. Her fiance, Hamp, is on speaker phone today.  She retired recently from working in [employer].Marland Kitchen  PREVIOUS RADIATION THERAPY: No  PAST MEDICAL HISTORY:  has a past medical history of Ductal carcinoma in situ (DCIS) of left breast, GERD (gastroesophageal reflux disease), and Hepatitis.    PAST SURGICAL HISTORY: Past Surgical History:  Procedure  Laterality Date   BREAST BIOPSY Right    BREAST BIOPSY Right    CHOLECYSTECTOMY     MASTECTOMY Left 1998   PARTIAL COLECTOMY      FAMILY HISTORY: family history includes Colon cancer in her father.  SOCIAL HISTORY:  reports that she has never smoked. She has never used smokeless tobacco. She reports current alcohol use. She reports that she does not use drugs.  ALLERGIES: Sulfur  MEDICATIONS:  Current Outpatient Medications  Medication Sig Dispense Refill   Calcium Carbonate-Vit D-Min (CALTRATE 600+D PLUS PO) Take by mouth daily.     DOCUSATE SODIUM PO Take 50 mg by mouth as needed.     Magnesium 250 MG TABS Take by mouth as needed.     Multiple Vitamin (MULTIVITAMIN) tablet Take 1 tablet by mouth daily.     Probiotic Product (PROBIOTIC DAILY PO) Take by mouth.     Turmeric (QC TUMERIC COMPLEX PO) Take 1,400 mg by mouth daily.     UNABLE TO FIND Goli Apple Cider Gummies   For Acid Reflux     No current facility-administered medications for this encounter.     REVIEW OF SYSTEMS: A 10+ POINT REVIEW OF SYSTEMS WAS OBTAINED including neurology, dermatology, psychiatry, cardiac, respiratory, lymph, extremities, GI, GU, Musculoskeletal, constitutional, breasts, reproductive, HEENT.  All pertinent positives are noted in the HPI.  All others are negative.   PHYSICAL EXAM:   Vitals:   11/18/18 0837  BP: 131/65  Pulse: 80  Resp: 17  Temp: 98.3 F (36.8 C)  SpO2: 100%   Filed  Weights   11/18/18 0837  Weight: 151 lb 9.6 oz (68.8 kg)   General: Alert and oriented, in no acute distress  Extremities: No cyanosis or edema. Lymphatics: see axillary breast exam  skin: No concerning lesions. Musculoskeletal: symmetric strength and muscle tone throughout.  Ambulatory Neurologic: Cranial nerves II through XII are grossly intact. No obvious focalities. Speech is fluent.  No tremor.   Psychiatric: Judgment and insight are intact. Affect is appropriate. Breasts: Left breast  status post mastectomy and reconstruction.  It appears that she underwent TRAM flap reconstruction.  Reconstructed nipple on the left.  No palpable or visible sign of recurrence on the left in the reconstructed breast or axilla.  On the right side no axillary adenopathy.  There is a palpable 2-1/2 cm mass at 1:00 in the right breast. No other palpable masses appreciated in the breasts or axillae bilaterally.  ECOG = 0  0 - Asymptomatic (Fully active, able to carry on all predisease activities without restriction)  1 - Symptomatic but completely ambulatory (Restricted in physically strenuous activity but ambulatory and able to carry out work of a light or sedentary nature. For example, light housework, office work)  2 - Symptomatic, <50% in bed during the day (Ambulatory and capable of all self care but unable to carry out any work activities. Up and about more than 50% of waking hours)  3 - Symptomatic, >50% in bed, but not bedbound (Capable of only limited self-care, confined to bed or chair 50% or more of waking hours)  4 - Bedbound (Completely disabled. Cannot carry on any self-care. Totally confined to bed or chair)  5 - Death   Eustace Pen MM, Creech RH, Tormey DC, et al. [phone]). "Toxicity and response criteria of the [facility] Group". [place] Oncol. 5 (6): 649-55   LABORATORY DATA:  Lab Results  Component Value Date   WBC 5.4 11/18/2018   HGB 12.9 11/18/2018   HCT 39.7 11/18/2018   MCV 92.1 11/18/2018   PLT 165 11/18/2018   CMP     Component Value Date/Time   NA 142 11/18/2018 0824   K 3.6 11/18/2018 0824   CL 105 11/18/2018 0824   CO2 27 11/18/2018 0824   GLUCOSE 60 (L) 11/18/2018 0824   BUN 15 11/18/2018 0824   CREATININE 0.84 11/18/2018 0824   CALCIUM 9.3 11/18/2018 0824   PROT 7.0 11/18/2018 0824   ALBUMIN 4.2 11/18/2018 0824   AST 21 11/18/2018 0824   ALT 18 11/18/2018 0824   ALKPHOS 87 11/18/2018 0824   BILITOT 0.7 11/18/2018 0824   GFRNONAA  >60 11/18/2018 0824   GFRAA >60 11/18/2018 0824         RADIOGRAPHY: US Breast Ltd Uni Right Inc Axilla  Result Date: 11/06/2018 CLINICAL DATA:  Patient presents as a recall from screening for a possible right breast mass and right breast calcifications. Patient has a history of left breast cancer in 1998 status post mastectomy. EXAM: DIGITAL DIAGNOSTIC RIGHT MAMMOGRAM WITH CAD AND TOMO ULTRASOUND RIGHT BREAST COMPARISON:  Previous exam(s). ACR Breast Density Category c: The breast tissue is heterogeneously dense, which may obscure small masses. FINDINGS: Mammogram: Additional spot compression views are performed for the possible mass in the upper inner right breast demonstrating persistence of a spiculated mass measuring 2.8 cm in diameter. Additional spot magnification views were performed for the possible calcifications in the outer right breast. On the additional views the calcifications are less conspicuous. The patient has small scattered calcifications throughout  the breast in a benign pattern. Mammographic images were processed with CAD. On physical exam, I palpate a fixed discrete mass in the upper inner quadrant of the right breast. Targeted ultrasound is performed in the right breast at 1 o'clock 3 cm from the nipple demonstrating an irregular hypoechoic mass with shadowing measuring 2.6 x 1.9 x 2.3 cm. This corresponds to the mammographic abnormality. Targeted ultrasound of the right axilla demonstrates no suspicious appearing lymph nodes. IMPRESSION: Right breast mass at 1 o'clock measuring up to 2.6 cm is highly suspicious. RECOMMENDATION: Ultrasound-guided core needle biopsy of the right breast mass at 1 o'clock. I have discussed the findings and recommendations with the patient. If applicable, a reminder letter will be sent to the patient regarding the next appointment. BI-RADS CATEGORY  5: Highly suggestive of malignancy. Electronically Signed   By: Audie Pinto M.D.   On: 11/06/2018  15:51   Mm Diag Breast Tomo Uni Right  Result Date: 11/06/2018 CLINICAL DATA:  Patient presents as a recall from screening for a possible right breast mass and right breast calcifications. Patient has a history of left breast cancer in 1998 status post mastectomy. EXAM: DIGITAL DIAGNOSTIC RIGHT MAMMOGRAM WITH CAD AND TOMO ULTRASOUND RIGHT BREAST COMPARISON:  Previous exam(s). ACR Breast Density Category c: The breast tissue is heterogeneously dense, which may obscure small masses. FINDINGS: Mammogram: Additional spot compression views are performed for the possible mass in the upper inner right breast demonstrating persistence of a spiculated mass measuring 2.8 cm in diameter. Additional spot magnification views were performed for the possible calcifications in the outer right breast. On the additional views the calcifications are less conspicuous. The patient has small scattered calcifications throughout the breast in a benign pattern. Mammographic images were processed with CAD. On physical exam, I palpate a fixed discrete mass in the upper inner quadrant of the right breast. Targeted ultrasound is performed in the right breast at 1 o'clock 3 cm from the nipple demonstrating an irregular hypoechoic mass with shadowing measuring 2.6 x 1.9 x 2.3 cm. This corresponds to the mammographic abnormality. Targeted ultrasound of the right axilla demonstrates no suspicious appearing lymph nodes. IMPRESSION: Right breast mass at 1 o'clock measuring up to 2.6 cm is highly suspicious. RECOMMENDATION: Ultrasound-guided core needle biopsy of the right breast mass at 1 o'clock. I have discussed the findings and recommendations with the patient. If applicable, a reminder letter will be sent to the patient regarding the next appointment. BI-RADS CATEGORY  5: Highly suggestive of malignancy. Electronically Signed   By: Audie Pinto M.D.   On: 11/06/2018 15:51   Mm 3d Screen Breast Uni Right  Result Date:  10/30/2018 CLINICAL DATA:  Screening. EXAM: DIGITAL SCREENING UNILATERAL RIGHT MAMMOGRAM WITH CAD AND TOMO COMPARISON:  Previous exam(s). ACR Breast Density Category c: The breast tissue is heterogeneously dense, which may obscure small masses. FINDINGS: In the right breast, a possible mass warrants further evaluation. In addition in the right breast, possible calcifications warrant further evaluation. In the left breast, no findings suspicious for malignancy. Images were processed with CAD. IMPRESSION: Further evaluation is suggested for possible mass in the right breast and for possible calcifications in the right breast. RECOMMENDATION: Diagnostic mammogram and possibly ultrasound of the right breast. (Code:[id number]) The patient will be contacted regarding the findings, and additional imaging will be scheduled. BI-RADS CATEGORY  0: Incomplete. Need additional imaging evaluation and/or prior mammograms for comparison. Electronically Signed   By: Audie Pinto M.D.   On: 10/30/2018 10:12  Mm Clip Placement Right  Result Date: 11/11/2018 CLINICAL DATA:  Status post ultrasound-guided core biopsy of mass in the 1 o'clock location of the RIGHT breast. EXAM: DIAGNOSTIC RIGHT MAMMOGRAM POST ULTRASOUND BIOPSY COMPARISON:  Previous exam(s). FINDINGS: Mammographic images were obtained following ultrasound guided biopsy of mass in the 1 o'clock location of the RIGHT breast and placement of a ribbon shaped clip. The ribbon shaped clip is identified within the mass in the [place], as expected. IMPRESSION: Tissue marker clip in the expected location following biopsy. Final Assessment: Post Procedure Mammograms for Marker Placement Electronically Signed   By: Nolon Nations M.D.   On: 11/11/2018 09:05   [place] Rt Breast Bx W Loc Dev 1st Lesion Img Bx Spec US Guide  Addendum Date: 11/12/2018   ADDENDUM REPORT: 11/12/2018 11:28 ADDENDUM: PATHOLOGY ADDENDUM: Pathology RIGHT breast 1 o'clock: Invasive ductal  carcinoma and ductal carcinoma in situ, grade II. Pathology concordance with imaging findings: Yes Recommendation: Consultation for treatment plan. Patient will be evaluated at the Multidisciplinary Clinic 11/18/2018. The patient is strongly considering RIGHT mastectomy and possible breast reconstruction. History of LEFT mastectomy and tram reconstruction. At the request of the patient, I spoke with her by telephone on 11/12/2018 at 09:30. She reports doing well after the biopsy . Electronically Signed   By: Nolon Nations M.D.   On: 11/12/2018 11:28   Result Date: 11/12/2018 CLINICAL DATA:  Patient returns for ultrasound-guided core biopsy of RIGHT breast mass. History of LEFT mastectomy. EXAM: ULTRASOUND GUIDED RIGHT BREAST CORE NEEDLE BIOPSY COMPARISON:  Previous exam(s). FINDINGS: I met with the patient and we discussed the procedure of ultrasound-guided biopsy, including benefits and alternatives. We discussed the high likelihood of a successful procedure. We discussed the risks of the procedure, including infection, bleeding, tissue injury, clip migration, and inadequate sampling. Informed written consent was given. The usual time-out protocol was performed immediately prior to the procedure. Lesion quadrant: UPPER INNER QUADRANT RIGHT breast Using sterile technique and 1% Lidocaine as local anesthetic, under direct ultrasound visualization, a 12 gauge spring-loaded device was used to perform biopsy of mass in the 1 o'clock location of the RIGHT breast using a inferior to superior approach. At the conclusion of the procedure a ribbon shaped tissue marker clip was deployed into the biopsy cavity. Follow up 2 view mammogram was performed and dictated separately. IMPRESSION: Ultrasound guided biopsy of RIGHT breast mass. No apparent complications. Electronically Signed: By: Nolon Nations M.D. On: 11/11/2018 08:57      IMPRESSION/PLAN: Right Breast Cancer   She is leaning towards breast conserving  surgery.  She would like to wait for her genetics results first.  She is going to speak with a genetics counselor today to see if retesting is warranted- ie if any updated panels are available  It was a pleasure meeting the patient today. We discussed the risks, benefits, and side effects of radiotherapy. I recommend radiotherapy to the right breast to reduce her risk of locoregional recurrence by 2/3.  We discussed that radiation would take approximately 4 weeks to complete and that I would give the patient a few weeks to heal following surgery before starting treatment planning.  If chemotherapy were to be given, this would precede radiotherapy. We spoke about acute effects including skin irritation and fatigue as well as much less common late effects including internal organ injury or irritation. We spoke about the latest technology that is used to minimize the risk of late effects for patients undergoing radiotherapy to the  breast or chest wall. No guarantees of treatment were given.   We also spoke about indications for postmastectomy radiotherapy in case she undergoes mastectomy.  It is possible though not likely she will need radiotherapy after mastectomy; this would be warranted if there is a positive margin or positive nodes.   The patient is enthusiastic about proceeding with treatment. I look forward to participating in the patient's care.  I will await her referral back to me for postoperative follow-up and eventual CT simulation/treatment planning.    __________________________________________   Eppie Gibson, MD  This document serves as a record of services personally performed by Eppie Gibson, MD. It was created on her behalf by Wilburn Mylar, a trained medical scribe. The creation of this record is based on the scribe's personal observations and the provider's statements to them. This document has been checked and approved by the attending provider.

## 2018-11-17 NOTE — Progress Notes (Signed)
[place] NOTE  Patient Care Team: Lavone Orn, MD as PCP - General (Internal Medicine) Rockwell Germany, RN as Oncology Nurse Navigator Mauro Kaufmann, RN as Oncology Nurse Navigator Alphonsa Overall, MD as Consulting Physician (General Surgery) Nicholas Lose, MD as Consulting Physician (Hematology and Oncology) Eppie Gibson, MD as Attending Physician (Radiation Oncology)  CHIEF COMPLAINTS/PURPOSE OF CONSULTATION:  Newly diagnosed breast cancer  HISTORY OF PRESENTING ILLNESS:  Ariel Johnson 68 y.o. female is here because of recent diagnosis of invasive ductal carcinoma of the right breast. The cancer was detected on a routine screening mammogram on 10/29/18 and was palpable on exam. Diagnostic mammogram and US of the right breast on 11/06/18 showed a 2.6cm mass at the 1 o'clock position in the right breast. Biopsy on 11/11/18 showed invasive ductal carcinoma with DCIS, grade 2, HER-2 negative (1+), ER+ 95%, PR+ 95%, Ki67 15%. She presents to the clinic today for initial evaluation and discussion of treatment options.   I reviewed her records extensively and collaborated the history with the patient.  SUMMARY OF ONCOLOGIC HISTORY: Oncology History  Malignant neoplasm of upper-inner quadrant of right breast in female, estrogen receptor positive ([place])  11/13/2018 Initial Diagnosis   Routine screening mammogram detected a right breast mass, 2.6cm, at the 1 o'clock position. Biopsy showed IDC with DCIS, grade 2, HER-2 - (1+), ER+ 95%, PR+ 95%, Ki67 15%.   11/18/2018 Cancer Staging   Staging form: Breast, AJCC 8th Edition - Clinical stage from 11/18/2018: Stage IB (cT2, cN0, cM0, G2, ER+, PR+, HER2-) - Signed by Nicholas Lose, MD on 11/18/2018     MEDICAL HISTORY:  Past Medical History:  Diagnosis Date  . Ductal carcinoma in situ (DCIS) of left breast   . GERD (gastroesophageal reflux disease)   . Hepatitis     SURGICAL HISTORY: Past Surgical History:   Procedure Laterality Date  . BREAST BIOPSY Right   . BREAST BIOPSY Right   . CHOLECYSTECTOMY    . MASTECTOMY Left 1998  . PARTIAL COLECTOMY      SOCIAL HISTORY: Social History   Socioeconomic History  . Marital status: Married    Spouse name: Not on file  . Number of children: Not on file  . Years of education: Not on file  . Highest education level: Not on file  Occupational History  . Not on file  Social Needs  . Financial resource strain: Not on file  . Food insecurity    Worry: Not on file    Inability: Not on file  . Transportation needs    Medical: Not on file    Non-medical: Not on file  Tobacco Use  . Smoking status: Never Smoker  . Smokeless tobacco: Never Used  Substance and Sexual Activity  . Alcohol use: Yes    Comment: 4-5 a week  . Drug use: Never  . Sexual activity: Not on file  Lifestyle  . Physical activity    Days per week: Not on file    Minutes per session: Not on file  . Stress: Not on file  Relationships  . Social Herbalist on phone: Not on file    Gets together: Not on file    Attends religious service: Not on file    Active member of club or organization: Not on file    Attends meetings of clubs or organizations: Not on file    Relationship status: Not on file  . Intimate partner violence  Fear of current or ex partner: Not on file    Emotionally abused: Not on file    Physically abused: Not on file    Forced sexual activity: Not on file  Other Topics Concern  . Not on file  Social History Narrative  . Not on file    FAMILY HISTORY: Family History  Problem Relation Age of Onset  . Colon cancer Father     ALLERGIES:  is allergic to sulfur.  MEDICATIONS:  Current Outpatient Medications  Medication Sig Dispense Refill  . Calcium Carbonate-Vit D-Min (CALTRATE 600+D PLUS PO) Take by mouth daily.    Marland Kitchen DOCUSATE SODIUM PO Take 50 mg by mouth as needed.    . Magnesium 250 MG TABS Take by mouth as needed.    .  Multiple Vitamin (MULTIVITAMIN) tablet Take 1 tablet by mouth daily.    . Probiotic Product (PROBIOTIC DAILY PO) Take by mouth.    . Turmeric (QC TUMERIC COMPLEX PO) Take 1,400 mg by mouth daily.    Marland Kitchen UNABLE TO FIND Goli Apple Cider Gummies   For Acid Reflux     No current facility-administered medications for this visit.     REVIEW OF SYSTEMS:   Constitutional: Denies fevers, chills or abnormal night sweats Eyes: Denies blurriness of vision, double vision or watery eyes Ears, nose, mouth, throat, and face: Denies mucositis or sore throat Respiratory: Denies cough, dyspnea or wheezes Cardiovascular: Denies palpitation, chest discomfort or lower extremity swelling Gastrointestinal:  Denies nausea, heartburn or change in bowel habits Skin: Denies abnormal skin rashes Lymphatics: Denies new lymphadenopathy or easy bruising Neurological:Denies numbness, tingling or new weaknesses Behavioral/Psych: Mood is stable, no new changes  Breast: Palpable right breast mass All other systems were reviewed with the patient and are negative.  PHYSICAL EXAMINATION: ECOG PERFORMANCE STATUS: 1 - Symptomatic but completely ambulatory  Vitals:   11/18/18 0837  BP: 131/65  Pulse: 80  Resp: 17  Temp: 98.3 F (36.8 C)  SpO2: 100%   Filed Weights   11/18/18 0837  Weight: 151 lb 9.6 oz (68.8 kg)    GENERAL:alert, no distress and comfortable SKIN: skin color, texture, turgor are normal, no rashes or significant lesions EYES: normal, conjunctiva are pink and non-injected, sclera clear OROPHARYNX:no exudate, no erythema and lips, buccal mucosa, and tongue normal  NECK: supple, thyroid normal size, non-tender, without nodularity LYMPH:  no palpable lymphadenopathy in the cervical, axillary or inguinal LUNGS: clear to auscultation and percussion with normal breathing effort HEART: regular rate & rhythm and no murmurs and no lower extremity edema ABDOMEN:abdomen soft, non-tender and normal bowel  sounds Musculoskeletal:no cyanosis of digits and no clubbing  PSYCH: alert & oriented x 3 with fluent speech NEURO: no focal motor/sensory deficits BREAST: No palpable nodules in breast. No palpable axillary or supraclavicular lymphadenopathy (exam performed in the presence of a chaperone)   LABORATORY DATA:  I have reviewed the data as listed Lab Results  Component Value Date   WBC 5.4 11/18/2018   HGB 12.9 11/18/2018   HCT 39.7 11/18/2018   MCV 92.1 11/18/2018   PLT 165 11/18/2018   Lab Results  Component Value Date   NA 142 11/18/2018   K 3.6 11/18/2018   CL 105 11/18/2018   CO2 27 11/18/2018    RADIOGRAPHIC STUDIES: I have personally reviewed the radiological reports and agreed with the findings in the report.  ASSESSMENT AND PLAN:  Malignant neoplasm of upper-inner quadrant of right breast in female, estrogen receptor positive ([place])  11/13/2018:Routine screening mammogram detected a right breast mass, 2.6cm, at the 1 o'clock position. Biopsy showed IDC with DCIS, grade 2, HER-2 - (1+), ER+ 95%, PR+ 95%, Ki67 15%. T2N0 stage Ib Prior history of left mastectomy 1998   Pathology and radiology counseling:Discussed with the patient, the details of pathology including the type of breast cancer,the clinical staging, the significance of ER, PR and HER-2/neu receptors and the implications for treatment. After reviewing the pathology in detail, we proceeded to discuss the different treatment options between surgery, radiation, chemotherapy, antiestrogen therapies.  Recommendations: 1. Breast conserving surgery versus mastectomy followed by 2. Oncotype DX testing to determine if chemotherapy would be of any benefit followed by 3. Adjuvant radiation therapy if she undergoes breast conserving surgery 4. Adjuvant antiestrogen therapy with anastrozole 1 mg daily x5 to 7 years  Oncotype counseling: I discussed Oncotype DX test. I explained to the patient that this is a 21 gene panel to  evaluate patient tumors DNA to calculate recurrence score. This would help determine whether patient has high risk or intermediate risk or low risk breast cancer. She understands that if her tumor was found to be high risk, she would benefit from systemic chemotherapy. If low risk, no need of chemotherapy. If she was found to be intermediate risk, we would need to evaluate the score as well as other risk factors and determine if an abbreviated chemotherapy may be of benefit.  Return to clinic after surgery to discuss final pathology report and then determine if Oncotype DX testing will need to be sent.  All questions were answered. The patient knows to call the clinic with any problems, questions or concerns.   Rulon Eisenmenger, MD 11/18/2018    I, Molly Dorshimer, am acting as scribe for Nicholas Lose, MD.  I have reviewed the above documentation for accuracy and completeness, and I agree with the above.

## 2018-11-18 ENCOUNTER — Inpatient Hospital Stay: Payer: PPO | Attending: Hematology and Oncology | Admitting: Hematology and Oncology

## 2018-11-18 ENCOUNTER — Ambulatory Visit (HOSPITAL_BASED_OUTPATIENT_CLINIC_OR_DEPARTMENT_OTHER): Payer: PPO | Admitting: Genetic Counselor

## 2018-11-18 ENCOUNTER — Other Ambulatory Visit: Payer: Self-pay

## 2018-11-18 ENCOUNTER — Ambulatory Visit: Payer: PPO | Attending: Surgery | Admitting: Physical Therapy

## 2018-11-18 ENCOUNTER — Ambulatory Visit
Admission: RE | Admit: 2018-11-18 | Discharge: 2018-11-18 | Disposition: A | Discharge status: Home / Self Care | Payer: PPO | Source: Ambulatory Visit | Attending: Radiation Oncology | Admitting: Radiation Oncology

## 2018-11-18 ENCOUNTER — Encounter: Payer: Self-pay | Admitting: Radiation Oncology

## 2018-11-18 ENCOUNTER — Encounter: Payer: Self-pay | Admitting: Hematology and Oncology

## 2018-11-18 ENCOUNTER — Encounter: Payer: Self-pay | Admitting: Genetic Counselor

## 2018-11-18 ENCOUNTER — Encounter: Payer: Self-pay | Admitting: Physical Therapy

## 2018-11-18 ENCOUNTER — Inpatient Hospital Stay: Payer: PPO

## 2018-11-18 DIAGNOSIS — Z170 Estrogen receptor positive status [ER+]: Secondary | ICD-10-CM

## 2018-11-18 DIAGNOSIS — Z800 Family history of malignant neoplasm of digestive organs: Secondary | ICD-10-CM | POA: Diagnosis not present

## 2018-11-18 DIAGNOSIS — R293 Abnormal posture: Secondary | ICD-10-CM

## 2018-11-18 DIAGNOSIS — Z9011 Acquired absence of right breast and nipple: Secondary | ICD-10-CM | POA: Insufficient documentation

## 2018-11-18 DIAGNOSIS — C50211 Malignant neoplasm of upper-inner quadrant of right female breast: Secondary | ICD-10-CM

## 2018-11-18 DIAGNOSIS — C50911 Malignant neoplasm of unspecified site of right female breast: Secondary | ICD-10-CM | POA: Diagnosis not present

## 2018-11-18 DIAGNOSIS — Z23 Encounter for immunization: Secondary | ICD-10-CM | POA: Diagnosis not present

## 2018-11-18 DIAGNOSIS — Z853 Personal history of malignant neoplasm of breast: Secondary | ICD-10-CM | POA: Insufficient documentation

## 2018-11-18 DIAGNOSIS — Z9012 Acquired absence of left breast and nipple: Secondary | ICD-10-CM

## 2018-11-18 DIAGNOSIS — Z86000 Personal history of in-situ neoplasm of breast: Secondary | ICD-10-CM | POA: Diagnosis not present

## 2018-11-18 LAB — CBC WITH DIFFERENTIAL (CANCER CENTER ONLY)
Abs Immature Granulocytes: 0.01 10*3/uL (ref 0.00–0.07)
Basophils Absolute: 0 10*3/uL (ref 0.0–0.1)
Basophils Relative: 1 %
Eosinophils Absolute: 0.1 10*3/uL (ref 0.0–0.5)
Eosinophils Relative: 2 %
HCT: 39.7 % (ref 36.0–46.0)
Hemoglobin: 12.9 g/dL (ref 12.0–15.0)
Immature Granulocytes: 0 %
Lymphocytes Relative: 33 %
Lymphs Abs: 1.8 10*3/uL (ref 0.7–4.0)
MCH: 29.9 pg (ref 26.0–34.0)
MCHC: 32.5 g/dL (ref 30.0–36.0)
MCV: 92.1 fL (ref 80.0–100.0)
Monocytes Absolute: 0.5 10*3/uL (ref 0.1–1.0)
Monocytes Relative: 10 %
Neutro Abs: 3 10*3/uL (ref 1.7–7.7)
Neutrophils Relative %: 54 %
Platelet Count: 165 10*3/uL (ref 150–400)
RBC: 4.31 MIL/uL (ref 3.87–5.11)
RDW: 13.1 % (ref 11.5–15.5)
WBC Count: 5.4 10*3/uL (ref 4.0–10.5)
nRBC: 0 % (ref 0.0–0.2)

## 2018-11-18 LAB — CMP (CANCER CENTER ONLY)
ALT: 18 U/L (ref 0–44)
AST: 21 U/L (ref 15–41)
Albumin: 4.2 g/dL (ref 3.5–5.0)
Alkaline Phosphatase: 87 U/L (ref 38–126)
Anion gap: 10 (ref 5–15)
BUN: 15 mg/dL (ref 8–23)
CO2: 27 mmol/L (ref 22–32)
Calcium: 9.3 mg/dL (ref 8.9–10.3)
Chloride: 105 mmol/L (ref 98–111)
Creatinine: 0.84 mg/dL (ref 0.44–1.00)
GFR, Est AFR Am: >60 mL/min (ref >60)
GFR, Estimated: >60 mL/min (ref >60)
Glucose, Bld: 60 mg/dL — ABNORMAL LOW (ref 70–99)
Potassium: 3.6 mmol/L (ref 3.5–5.1)
Sodium: 142 mmol/L (ref 135–145)
Total Bilirubin: 0.7 mg/dL (ref 0.3–1.2)
Total Protein: 7 g/dL (ref 6.5–8.1)

## 2018-11-18 NOTE — Assessment & Plan Note (Signed)
11/13/2018:Routine screening mammogram detected a right breast mass, 2.6cm, at the 1 o'clock position. Biopsy showed IDC with DCIS, grade 2, HER-2 - (1+), ER+ 95%, PR+ 95%, Ki67 15%. T2N0 stage Ib Prior history of left mastectomy 1998   Pathology and radiology counseling:Discussed with the patient, the details of pathology including the type of breast cancer,the clinical staging, the significance of ER, PR and HER-2/neu receptors and the implications for treatment. After reviewing the pathology in detail, we proceeded to discuss the different treatment options between surgery, radiation, chemotherapy, antiestrogen therapies.  Recommendations: 1. Breast conserving surgery versus mastectomy followed by 2. Oncotype DX testing to determine if chemotherapy would be of any benefit followed by 3. Adjuvant radiation therapy if she undergoes breast conserving surgery 4. Adjuvant antiestrogen therapy  Oncotype counseling: I discussed Oncotype DX test. I explained to the patient that this is a 21 gene panel to evaluate patient tumors DNA to calculate recurrence score. This would help determine whether patient has high risk or intermediate risk or low risk breast cancer. She understands that if her tumor was found to be high risk, she would benefit from systemic chemotherapy. If low risk, no need of chemotherapy. If she was found to be intermediate risk, we would need to evaluate the score as well as other risk factors and determine if an abbreviated chemotherapy may be of benefit.  Return to clinic after surgery to discuss final pathology report and then determine if Oncotype DX testing will need to be sent.

## 2018-11-18 NOTE — Therapy (Signed)
[place], [place], [address] Phone: [phone]   Fax:  [phone]  Physical Therapy Evaluation  Patient Details  Name: Ariel Johnson MRN: [id number] Date of Birth: December 30, 1950 Referring Provider (PT): Dr. Alphonsa Overall   Encounter Date: 11/18/2018  PT End of Session - 11/18/18 1026    Visit Number  1    Number of Visits  2    Date for PT Re-Evaluation  01/13/19    PT Start Time  0930    PT Stop Time  1002    PT Time Calculation (min)  32 min    Activity Tolerance  Patient tolerated treatment well    Behavior During Therapy  [facility] for tasks assessed/performed       Past Medical History:  Diagnosis Date  . Ductal carcinoma in situ (DCIS) of left breast   . GERD (gastroesophageal reflux disease)   . Hepatitis     Past Surgical History:  Procedure Laterality Date  . BREAST BIOPSY Right   . BREAST BIOPSY Right   . CHOLECYSTECTOMY    . MASTECTOMY Left 1998  . PARTIAL COLECTOMY      There were no vitals filed for this visit.   Subjective Assessment - 11/18/18 1011    Subjective  Patient reports she is here today to be seen by her medical team for her newly diagnosed right breast cancer.    Pertinent History  Patient was diagnosed on 10/29/2018 with right grade II invasive ductal carcinoma breast cancer. It measures 2.6 cm and is located in the upper inner quadrant. It is ER/PR positive and HER2 negative with a Ki67 of 15%. She has a history of left breast cancer in 1998 with a mastectomy but it is unknown how many axillary nodes were removed. She reports never having left arm lymphedema.    Patient Stated Goals  Reduce right arm lymphedema risk and learn post op shoulder ROM HEP    Currently in Pain?  Yes    Pain Score  1     Pain Location  Axilla    Pain Orientation  Left    Pain Descriptors / Indicators  Aching    Pain Type  Acute pain    Pain Onset  In the past 7 days    Pain  Frequency  Intermittent    Aggravating Factors   Unknown    Pain Relieving Factors  Unknown    Multiple Pain Sites  No         [facility] PT Assessment - 11/18/18 0001      Assessment   Medical Diagnosis  Right arm lymphedema    Referring Provider (PT)  Dr. Alphonsa Overall    Onset Date/Surgical Date  10/29/18    Hand Dominance  Right    Prior Therapy  none      Precautions   Precautions  Other (comment)    Precaution Comments  active cancer      Restrictions   Weight Bearing Restrictions  No      Balance Screen   Has the patient fallen in the past 6 months  No    Has the patient had a decrease in activity level because of a fear of falling?   No    Is the patient reluctant to leave their home because of a fear of falling?   No      Home Film/video editor residence    Living Arrangements  Alone    Available Help at Discharge  Friend(s)      Prior Function   Level of Independence  Independent    Vocation  Retired    Leisure  She walks 3x/week for 45 minutes      Cognition   Overall Cognitive Status  Within Functional Limits for tasks assessed      Posture/Postural Control   Posture/Postural Control  Postural limitations    Postural Limitations  Rounded Shoulders;Forward head      ROM / Strength   AROM / PROM / Strength  AROM;Strength      AROM   AROM Assessment Site  Shoulder;Cervical    Right/Left Shoulder  Right;Left    Right Shoulder Extension  45 Degrees    Right Shoulder Flexion  156 Degrees    Right Shoulder ABduction  165 Degrees    Right Shoulder Internal Rotation  66 Degrees    Right Shoulder External Rotation  86 Degrees    Left Shoulder Extension  43 Degrees    Left Shoulder Flexion  146 Degrees    Left Shoulder ABduction  160 Degrees    Left Shoulder Internal Rotation  60 Degrees    Left Shoulder External Rotation  84 Degrees    Cervical Flexion  WNL    Cervical Extension  WNL    Cervical - Right Side Bend  WNL    Cervical -  Left Side Bend  WNL    Cervical - Right Rotation  25% limited    Cervical - Left Rotation  25% limited      Strength   Overall Strength  Within functional limits for tasks performed        LYMPHEDEMA/ONCOLOGY QUESTIONNAIRE - 11/18/18 1023      Type   Cancer Type  Right breast cancer      Lymphedema Assessments   Lymphedema Assessments  Upper extremities      Right Upper Extremity Lymphedema   10 cm Proximal to Olecranon Process  28.3 cm    Olecranon Process  23.2 cm    10 cm Proximal to Ulnar Styloid Process  20.5 cm    Just Proximal to Ulnar Styloid Process  14.5 cm    Across Hand at [employer]  18.4 cm    At [place] of 2nd Digit  6 cm      Left Upper Extremity Lymphedema   10 cm Proximal to Olecranon Process  28.5 cm    Olecranon Process  23.2 cm    10 cm Proximal to Ulnar Styloid Process  20.3 cm    Just Proximal to Ulnar Styloid Process  14.5 cm    Across Hand at [employer]  18.6 cm    At [place] of 2nd Digit  5.9 cm          Quick Dash - 11/18/18 0001    Open a tight or new jar  Mild difficulty    Do heavy household chores (wash walls, wash floors)  No difficulty    Carry a shopping bag or briefcase  No difficulty    Wash your back  No difficulty    Use a knife to cut food  No difficulty    Recreational activities in which you take some force or impact through your arm, shoulder, or hand (golf, hammering, tennis)  No difficulty    During the past week, to what extent has your arm, shoulder or hand problem interfered with your normal social activities with family,  friends, neighbors, or groups?  Not at all    During the past week, to what extent has your arm, shoulder or hand problem limited your work or other regular daily activities  Not at all    Arm, shoulder, or hand pain.  None    Tingling (pins and needles) in your arm, shoulder, or hand  None    Difficulty Sleeping  No difficulty    DASH Score  2.27 %        Objective measurements completed on  examination: See above findings.      Patient was instructed today in a home exercise program today for post op shoulder range of motion. These included active assist shoulder flexion in sitting, scapular retraction, wall walking with shoulder abduction, and hands behind head external rotation.  She was encouraged to do these twice a day, holding 3 seconds and repeating 5 times when permitted by her physician.     PT Education - 11/18/18 1024    Education Details  Lymphedema risk reduction and post op shoulder ROM HEP    Person(s) Educated  Patient    Methods  Explanation;Demonstration;Handout    Comprehension  Returned demonstration;Verbalized understanding          PT Long Term Goals - 11/18/18 1107      PT LONG TERM GOAL #1   Title  Patient will demonstrate she has regained shoulder ROM and function post operatively compared to baselines.    Time  8    Period  Weeks    Status  New    Target Date  01/13/19      Breast Clinic Goals - 11/18/18 1106      Patient will be able to verbalize understanding of pertinent lymphedema risk reduction practices relevant to her diagnosis specifically related to skin care.   Time  1    Period  Days    Status  Achieved      Patient will be able to return demonstrate and/or verbalize understanding of the post-op home exercise program related to regaining shoulder range of motion.   Time  1    Period  Days    Status  Achieved      Patient will be able to verbalize understanding of the importance of attending the postoperative After Breast Cancer Class for further lymphedema risk reduction education and therapeutic exercise.   Time  1    Period  Days    Status  Achieved            Plan - 11/18/18 1026    Clinical Impression Statement  Patient was diagnosed on 10/29/2018 with right grade II invasive ductal carcinoma breast cancer. It measures 2.6 cm and is located in the upper inner quadrant. It is ER/PR positive and HER2 negative  with a Ki67 of 15%. She has a history of left breast cancer in 1998 with a mastectomy but it is unknown how many axillary nodes were removed. She reports never having left arm lymphedema. Her multidisciplinary medical team met prior ot her assessments to determine a recommended treatment plan. She is planning to have a right lumpectomy or mastectomy with a sentinel node biopsy. She would consider reconstruction if she has a mastectomy. She will undergo Oncotype testing, radiation (if she has a lumpectomy), and anti-estrogen therapy. She will benefit from a post op PT visit to reassess and determine needs.    Stability/Clinical Decision Making  Stable/Uncomplicated    Clinical Decision Making  Low  Rehab Potential  Excellent    PT Frequency  --   Eval and 1 f/u visit   PT Treatment/Interventions  ADLs/Self Care Home Management;Therapeutic exercise;Patient/family education    PT Next Visit Plan  Will reassess 3-4 weeks post op to determine needs    PT Home Exercise Plan  Post op shoulder ROM HEP    Consulted and Agree with Plan of Care  Patient       Patient will benefit from skilled therapeutic intervention in order to improve the following deficits and impairments:  Postural dysfunction, Decreased range of motion, Pain, Impaired UE functional use, Decreased knowledge of precautions  Visit Diagnosis: Malignant neoplasm of upper-inner quadrant of right breast in female, estrogen receptor positive ([place]) - Plan: PT plan of care cert/re-cert  Abnormal posture - Plan: PT plan of care cert/re-cert   Patient will follow up at outpatient cancer rehab 3-4 weeks following surgery.  If the patient requires physical therapy at that time, a specific plan will be dictated and sent to the referring physician for approval. The patient was educated today on appropriate basic range of motion exercises to begin post operatively and the importance of attending the After Breast Cancer class following surgery.   Patient was educated today on lymphedema risk reduction practices as it pertains to recommendations that will benefit the patient immediately following surgery.  She verbalized good understanding.      Problem List Patient Active Problem List   Diagnosis Date Noted  . Malignant neoplasm of upper-inner quadrant of right breast in female, estrogen receptor positive ([place]) 11/13/2018   Annia Friendly, PT 11/18/18 11:10 AM  [place], [place], [address] Phone: [phone]   Fax:  [phone]  Name: Ariel Johnson MRN: [id number] Date of Birth: [birth year]

## 2018-11-18 NOTE — Patient Instructions (Signed)

## 2018-11-18 NOTE — Progress Notes (Signed)
REFERRING PROVIDER: Nicholas Lose, MD [address] [place],  [place] [address]  PRIMARY PROVIDER:  Lavone Orn, MD  PRIMARY REASON FOR VISIT:  1. Malignant neoplasm of upper-inner quadrant of right breast in female, estrogen receptor positive ([place])   2. History of cancer of left breast     I connected with Ariel Johnson on 11/18/2018 at 11:00am EDT by Webex video conference and verified that I am speaking with the correct person using two identifiers.   Patient location: clinic Provider location: clinic   HISTORY OF PRESENT ILLNESS:   [name], a 68 y.o. female, was seen for a [facility] cancer genetics consultation at the request of Dr. Lindi Adie due to a personal history of breast cancer.  [name] presents to clinic today to discuss the possibility of a hereditary predisposition to cancer, genetic testing, and to further clarify her future cancer risks, as well as potential cancer risks for family members.   In 1998, at the age of 6, Ariel Johnson was diagnosed with cancer of the left breast. The treatment plan included a left mastectomy. Now, in 2020 at the age of 34, Ariel Johnson was diagnosed with DCIS and IDC, ER+/PR+/Her2-, of the right breast. The treatment plan includes surgery, oncotype, and potentially radiation.    [name] had genetic testing through Dr. Delene Ruffini office 5-10 years ago that was negative. This report was not available for review at today's visit, and [name] is unsure if this test looked only at BRCA1/2, or if it looked at additional genes.   CANCER HISTORY:  Oncology History  Malignant neoplasm of upper-inner quadrant of right breast in female, estrogen receptor positive ([place])  11/13/2018 Initial Diagnosis   Routine screening mammogram detected a right breast mass, 2.6cm, at the 1 o'clock position. Biopsy showed IDC with DCIS, grade 2, HER-2 - (1+), ER+ 95%, PR+ 95%, Ki67 15%.   11/18/2018 Cancer Staging   Staging form: Breast, AJCC 8th  Edition - Clinical stage from 11/18/2018: Stage IB (cT2, cN0, cM0, G2, ER+, PR+, HER2-) - Signed by Nicholas Lose, MD on 11/18/2018      RISK FACTORS:  Menarche was at age 54.  First live birth at age 66.  OCP use for approximately 20 years.  Ovaries intact: yes.  Hysterectomy: no.  Menopausal status: postmenopausal.  HRT use: 0 years. Colonoscopy: yes; had part of her colon removed. Mammogram within the last year: yes. Number of breast biopsies: 3. Any excessive radiation exposure in the past: no  Past Medical History:  Diagnosis Date  . Ductal carcinoma in situ (DCIS) of left breast   . GERD (gastroesophageal reflux disease)   . Hepatitis   . History of cancer of left breast     Past Surgical History:  Procedure Laterality Date  . BREAST BIOPSY Right   . BREAST BIOPSY Right   . CHOLECYSTECTOMY    . MASTECTOMY Left 1998  . PARTIAL COLECTOMY      Social History   Socioeconomic History  . Marital status: Married    Spouse name: Not on file  . Number of children: Not on file  . Years of education: Not on file  . Highest education level: Not on file  Occupational History  . Not on file  Social Needs  . Financial resource strain: Not on file  . Food insecurity    Worry: Not on file    Inability: Not on file  . Transportation needs    Medical: Not on file  Non-medical: Not on file  Tobacco Use  . Smoking status: Never Smoker  . Smokeless tobacco: Never Used  Substance and Sexual Activity  . Alcohol use: Yes    Comment: 4-5 a week  . Drug use: Never  . Sexual activity: Not on file  Lifestyle  . Physical activity    Days per week: Not on file    Minutes per session: Not on file  . Stress: Not on file  Relationships  . Social Herbalist on phone: Not on file    Gets together: Not on file    Attends religious service: Not on file    Active member of club or organization: Not on file    Attends meetings of clubs or organizations: Not on file     Relationship status: Not on file  Other Topics Concern  . Not on file  Social History Narrative  . Not on file     FAMILY HISTORY:  We obtained a detailed, 4-generation family history.  Significant diagnoses are listed below: Family History  Problem Relation Age of Onset  . Colon cancer Father 63       possible colon cancer  . Heart disease Maternal Grandmother   . Heart disease Maternal Grandfather   . Arthritis Paternal Grandmother    [name] has two children - a son who is 59 and a daughter who is 64. She has a paternal half-sister who is 54 and has not had cancer.  Ariel Johnson's mother died at the age of 80. She had two maternal uncles, one who died in his early 60s and one who died in his 67s. There are no known cancer diagnoses among maternal first cousins. Her maternal grandmother died at age 33, and grandfather died in his early 79s, both due to heart disease.  Ariel Johnson's father died at age 79. He had part of his colon removed when he was 79 and he may have had colon cancer, although this is not confirmed. She had two paternal aunts and five paternal uncles, almost all died at ages older than 9 except for two uncles. There are no known diagnoses of cancer among these relatives or any paternal first cousins. Ariel Johnson's paternal grandmother died in her 49s and had arthritis, and her grandfather died in his mid 65s.  [name] is unaware of previous family history of genetic testing for hereditary cancer risks. Patient's maternal ancestors are of [place] descent, and paternal ancestors are of [place] descent. There is no reported Ashkenazi Jewish ancestry. There is no known consanguinity.  GENETIC COUNSELING ASSESSMENT: [name] is a 68 y.o. female with a personal history of breast cancer, which is somewhat suggestive of a hereditary cancer syndrome. We, therefore, discussed and recommended the following at today's visit.   DISCUSSION: We discussed that 5 - 10% of  breast cancer is hereditary, with most cases associated with BRCA1/2.  There are other genes that can be associated with hereditary breast cancer syndromes.  These include ATM, CHEK2, PALB2, etc.  We discussed that identifying a hereditary cancer syndrome is beneficial for several reasons including knowing about other cancer risks, identifying potential screening and risk-reduction options that may be appropriate, and to understand if other family members could be at risk for cancer and allow them to undergo genetic testing.  We discussed that it will be important to find Ariel Johnson's previous genetic testing results and determine which genes were tested. It is possible that her genetic testing  was not comprehensive and that she would need updated genetic testing. Additionally, [name] is concerned about potential female cancer risks that may not be revealed due to her limited maternal family structure (fewer than 2 first- or second-degree female relatives surviving beyond age 75).  We reviewed the characteristics, features and inheritance patterns of hereditary cancer syndromes. We also discussed genetic testing, including the appropriate family members to test, the process of testing, insurance coverage and turn-around-time for results. We discussed the implications of a negative, positive and/or variant of uncertain significant result. In order to get genetic test results in a timely manner so that Ariel Johnson can use these genetic test results for surgical decisions, we recommended Ariel Johnson pursue genetic testing for the [facility] STAT Breast Cancer panel. Once complete, we recommend Ariel Johnson pursue reflex genetic testing to the Common Hereditary Cancers panel.   The STAT Breast cancer panel offered by [facility] includes sequencing and rearrangement analysis for the following 9 genes:  ATM, BRCA1, BRCA2, CDH1, CHEK2, PALB2, PTEN, STK11 and TP53.     The Common Hereditary Cancers Panel offered by  [facility] includes sequencing and/or deletion duplication testing of the following 48 genes: APC, ATM, AXIN2, BARD1, BMPR1A, BRCA1, BRCA2, BRIP1, CDH1, CDK4, CDKN2A (p14ARF), CDKN2A (p16INK4a), CHEK2, CTNNA1, DICER1, EPCAM (Deletion/duplication testing only), GREM1 (promoter region deletion/duplication testing only), KIT, MEN1, MLH1, MSH2, MSH3, MSH6, MUTYH, NBN, NF1, NHTL1, PALB2, PDGFRA, PMS2, POLD1, POLE, PTEN, RAD50, RAD51C, RAD51D, RNF43, SDHB, SDHC, SDHD, SMAD4, SMARCA4. STK11, TP53, TSC1, TSC2, and VHL.  The following genes were evaluated for sequence changes only: SDHA and HOXB13 c.251G>A variant only.   Based on Ariel Johnson's personal history of cancer, she meets medical criteria for genetic testing. Despite that she meets criteria, she may still have an out of pocket cost.   PLAN: After considering the risks, benefits, and limitations, Ariel Johnson provided informed consent to pursue genetic testing and the blood sample was sent to [facility] for analysis of the STAT Breast Cancer Panel + Common Hereditary Cancers Panel. Results should be available within approximately one weeks' time, at which point they will be disclosed by telephone to Ariel Johnson, as will any additional recommendations warranted by these results. [name] will receive a summary of her genetic counseling visit and a copy of her results once available. This information will also be available in Epic.   [name] will attempt to find her previous genetic testing results and will share that information with [place] if/when she finds the report.  [name] questions were answered to her satisfaction today. Our contact information was provided should additional questions or concerns arise. Thank you for the referral and allowing [place] to share in the care of your patient.   Clint Guy, MS, [facility] Certified Genetic Counselor [email or web address] Phone: [phone]  The patient was seen for a total of 25  minutes in face-to-face genetic counseling.  This patient was discussed with Drs. Magrinat, Lindi Adie and/or Burr Medico who agrees with the above.    _______________________________________________________________________ For Office Staff:  Number of people involved in session: 1 Was an Intern/ student involved with case: no

## 2018-11-20 NOTE — Progress Notes (Signed)
A copy of Ariel Johnson's previous genetic testing report was obtained (see below). She received genetic testing for the BRCA1/2 genes in 2015, the results of which were negative. Updated genetic testing to include additional genes associated with hereditary breast cancer is warranted.

## 2018-11-25 ENCOUNTER — Telehealth: Payer: Self-pay | Admitting: Genetic Counselor

## 2018-11-25 ENCOUNTER — Telehealth: Payer: Self-pay

## 2018-11-25 DIAGNOSIS — Z1379 Encounter for other screening for genetic and chromosomal anomalies: Secondary | ICD-10-CM | POA: Insufficient documentation

## 2018-11-25 NOTE — Telephone Encounter (Signed)
Nutrition Assessment  Reason for Assessment:  Pt attended Breast Clinic on 9/30 and nurse navigator provided nutrition packet.  ASSESSMENT:  68 year old female with new diagnosis of right breast cancer.  Past medical history of left mastectomy, GERD, hepatitis.  Spoke with patient this am to introduce self and service at [facility].  Patient reports that she is doing well and eating well.  Eats well-balanced diet.   Medications:  Calcium carbonate, Mag, MVI, probiotic, turmeric  Labs: reviewed  Anthropometrics:   Height: 64 inches Weight: 151 lb 9.6 oz BMI: 26   NUTRITION DIAGNOSIS: Food and nutrition related knowledge deficit related to new diagnosis of breast cancer as evidenced by no prior need for nutrition related information.  INTERVENTION:   Discussed briefly packet of information regarding nutritional tips for breast cancer patients.  No questions at this time.  Contact information provided and patient knows to contact me with questions/concerns.    MONITORING, EVALUATION, and GOAL: Pt will consume a healthy plant based diet to maintain lean body mass throughout treatment.   Ariel Johnson B. Zenia Resides, [place], [place] Registered Dietitian [phone] (pager)

## 2018-11-25 NOTE — Telephone Encounter (Signed)
Ariel Johnson called to check on the status of her genetic test results because she is hoping to schedule her surgery soon. Unfortunately the results are not yet available. I will update her when we receive them.

## 2018-11-26 ENCOUNTER — Encounter: Payer: Self-pay | Admitting: Genetic Counselor

## 2018-11-26 ENCOUNTER — Ambulatory Visit: Payer: Self-pay | Admitting: Genetic Counselor

## 2018-11-26 ENCOUNTER — Telehealth: Payer: Self-pay | Admitting: Genetic Counselor

## 2018-11-26 ENCOUNTER — Telehealth: Payer: Self-pay | Admitting: *Deleted

## 2018-11-26 DIAGNOSIS — Z1379 Encounter for other screening for genetic and chromosomal anomalies: Secondary | ICD-10-CM

## 2018-11-26 NOTE — Telephone Encounter (Signed)
Spoke to pt concerning Ariel Johnson from 9.30.20. Denies questions regarding dx or treatment care plan. Discussed next step will be sx with Dr. Lucia Gaskins as we received negative genetic testing results. Informed she will receive a call from Dr. Pollie Friar sx scheduler to discuss sx date and time as well as instructions. Denies further questions or needs. Encourage pt to call with any concerns. Received verbal understanding.

## 2018-11-26 NOTE — Telephone Encounter (Signed)
Revealed negative genetic testing.  Discussed that we do not know why she has had breast cancer twice. It could be due to a different gene that we are not testing, or maybe our current technology may not be able to pick something up.  It will be important for her to keep in contact with genetics to keep up with whether additional testing may be needed.

## 2018-11-26 NOTE — Progress Notes (Signed)
HPI:  [name] was previously seen in the [facility] due to a personal history of breast cancer and concerns regarding a hereditary predisposition to cancer. Please refer to our prior cancer genetics clinic note for more information regarding our discussion, assessment and recommendations, at the time. [name] recent genetic test results were disclosed to her, as were recommendations warranted by these results. These results and recommendations are discussed in more detail below.  CANCER HISTORY:  Oncology History  Malignant neoplasm of upper-inner quadrant of right breast in female, estrogen receptor positive ([place])  11/13/2018 Initial Diagnosis   Routine screening mammogram detected a right breast mass, 2.6cm, at the 1 o'clock position. Biopsy showed IDC with DCIS, grade 2, HER-2 - (1+), ER+ 95%, PR+ 95%, Ki67 15%.   11/18/2018 Cancer Staging   Staging form: Breast, AJCC 8th Edition - Clinical stage from 11/18/2018: Stage IB (cT2, cN0, cM0, G2, ER+, PR+, HER2-) - Signed by Nicholas Lose, MD on 11/18/2018   11/25/2018 Genetic Testing   Negative genetic testing on the [facility] Breast Cancer STAT panel and the Common Hereditary Cancers panel. The report date is 11/25/2018.  The STAT Breast cancer panel offered by [facility] includes sequencing and rearrangement analysis for the following 9 genes:  ATM, BRCA1, BRCA2, CDH1, CHEK2, PALB2, PTEN, STK11 and TP53.  The Common Hereditary Cancers Panel offered by [facility] includes sequencing and/or deletion duplication testing of the following 48 genes: APC, ATM, AXIN2, BARD1, BMPR1A, BRCA1, BRCA2, BRIP1, CDH1, CDK4, CDKN2A (p14ARF), CDKN2A (p16INK4a), CHEK2, CTNNA1, DICER1, EPCAM (Deletion/duplication testing only), GREM1 (promoter region deletion/duplication testing only), KIT, MEN1, MLH1, MSH2, MSH3, MSH6, MUTYH, NBN, NF1, NHTL1, PALB2, PDGFRA, PMS2, POLD1, POLE, PTEN, RAD50, RAD51C, RAD51D, RNF43, SDHB, SDHC, SDHD, SMAD4, SMARCA4.  STK11, TP53, TSC1, TSC2, and VHL.  The following genes were evaluated for sequence changes only: SDHA and HOXB13 c.251G>A variant only.      FAMILY HISTORY:  We obtained a detailed, 4-generation family history.  Significant diagnoses are listed below: Family History  Problem Relation Age of Onset   Colon cancer Father 58       possible colon cancer   Heart disease Maternal Grandmother    Heart disease Maternal Grandfather    Arthritis Paternal Grandmother     [name] has two children - a son who is 31 and a daughter who is 3. She has a paternal half-sister who is 61 and has not had cancer.  [name]'s mother died at the age of 37. She had two maternal uncles, one who died in his early 66s and one who died in his 21s. There are no known cancer diagnoses among maternal first cousins. Her maternal grandmother died at age 3, and grandfather died in his early 34s, both due to heart disease.  [name]'s father died at age 42. He had part of his colon removed when he was 49 and he may have had colon cancer, although this is not confirmed. She had two paternal aunts and five paternal uncles, almost all died at ages older than 65 except for two uncles. There are no known diagnoses of cancer among these relatives or any paternal first cousins. [name]'s paternal grandmother died in her 13s and had arthritis, and her grandfather died in his mid 39s.  [name] is unaware of previous family history of genetic testing for hereditary cancer risks. Patient's maternal ancestors are of [place] descent, and paternal ancestors are of [place] descent. There is no reported Ashkenazi [place]  ancestry. There is no known consanguinity.  GENETIC TEST RESULTS: Genetic testing reported out on 11/25/2018 through the [facility] Breast Cancer STAT panel + Common Hereditary Cancers panel found no pathogenic variants. The STAT Breast cancer panel offered by [facility] includes sequencing and rearrangement  analysis for the following 9 genes:  ATM, BRCA1, BRCA2, CDH1, CHEK2, PALB2, PTEN, STK11 and TP53.  The Common Hereditary Gene Panel offered by [facility] includes sequencing and/or deletion duplication testing of the following 48 genes: APC, ATM, AXIN2, BARD1, BMPR1A, BRCA1, BRCA2, BRIP1, CDH1, CDK4, CDKN2A (p14ARF), CDKN2A (p16INK4a), CHEK2, CTNNA1, DICER1, EPCAM (Deletion/duplication testing only), GREM1 (promoter region deletion/duplication testing only), KIT, MEN1, MLH1, MSH2, MSH3, MSH6, MUTYH, NBN, NF1, NHTL1, PALB2, PDGFRA, PMS2, POLD1, POLE, PTEN, RAD50, RAD51C, RAD51D, RNF43, SDHB, SDHC, SDHD, SMAD4, SMARCA4. STK11, TP53, TSC1, TSC2, and VHL.  The following genes were evaluated for sequence changes only: SDHA and HOXB13 c.251G>A variant only. The test report will be scanned into EPIC and located under the Molecular Pathology section of the Results Review tab.  A portion of the result report is included below for reference.     We discussed with [name] that because current genetic testing is not perfect, it is possible there may be a gene mutation in one of these genes that current testing cannot detect, but that chance is small.  We also discussed, that there could be another gene that has not yet been discovered, or that we have not yet tested, that is responsible for the cancer diagnoses in the family. It is also possible there is a hereditary cause for the cancer in the family that [name] did not inherit and therefore was not identified in her testing.  Therefore, it is important to remain in touch with cancer genetics in the future so that we can continue to offer [name] the most up to date genetic testing.   CANCER SCREENING RECOMMENDATIONS: [name]'s test result is considered negative (normal).  This means that we have not identified a hereditary cause for her personal history of cancer at this time. Most cancers happen by chance and this negative test suggests that her cancer  may fall into this category.    While reassuring, this does not definitively rule out a hereditary predisposition to cancer. It is still possible that there could be genetic mutations that are undetectable by current technology. There could be genetic mutations in genes that have not been tested or identified to increase cancer risk.  Therefore, it is recommended she continue to follow the cancer management and screening guidelines provided by her oncology and primary healthcare provider.   An individual's cancer risk and medical management are not determined by genetic test results alone. Overall cancer risk assessment incorporates additional factors, including personal medical history, family history, and any available genetic information that may result in a personalized plan for cancer prevention and surveillance  RECOMMENDATIONS FOR FAMILY MEMBERS:  Individuals in this family might be at some increased risk of developing cancer, over the general population risk, simply due to the family history of cancer.  We recommended women in this family have a yearly mammogram beginning at age 66, or 26 years younger than the earliest onset of cancer, an annual clinical breast exam, and perform monthly breast self-exams. Women in this family should also have a gynecological exam as recommended by their primary provider. All family members should have a colonoscopy by age 66.   FOLLOW-UP: Lastly, we discussed with [name] that cancer genetics is  a rapidly advancing field and it is possible that new genetic tests will be appropriate for her and/or her family members in the future. We encouraged her to remain in contact with cancer genetics on an annual basis so we can update her personal and family histories and let her know of advances in cancer genetics that may benefit this family.   Our contact number was provided. [name] questions were answered to her satisfaction, and she knows she is welcome to  call us at anytime with additional questions or concerns.   Clint Guy, MS, [facility] Certified Genetic Counselor [email or web address] Phone: [phone]

## 2018-11-27 ENCOUNTER — Encounter: Payer: Self-pay | Admitting: General Practice

## 2018-11-27 NOTE — Progress Notes (Signed)
[facility] Psychosocial Distress Screening Spiritual Care  Met with Ariel Johnson by phone following [facility] to introduce [facility] team/resources, reviewing distress screen per protocol.  The patient scored a 5 on the Psychosocial Distress Thermometer which indicates moderate distress. Also assessed for distress and other psychosocial needs.   ONCBCN DISTRESS SCREENING 11/27/2018  Screening Type Initial Screening  Distress experienced in past week (1-10) 5  Emotional problem type Nervousness/Anxiety;Adjusting to illness  Information Concerns Type Lack of info about treatment;Lack of info about complementary therapy choices  Referral to palliative care Yes    Ms Ariel Johnson reports great support from family and friends, and sees her dx/sx as a "blip" in her experience. She is excitedly anticipating her wedding in the next couple of weeks, sharing that both she and her fiance lost their previous spouses.  Follow up needed: No. Ms Ariel Johnson notes that her grandson is coping with her breast ca dx and a grandfather's prostate ca dx, so I emailed her all my contact information in case anyone in the family wants to contact me or have me reach out. Please also page if additional needs arise or circumstances change. Thank you!   [place], [place], [facility] Pager [phone] Voicemail [phone]

## 2018-11-28 ENCOUNTER — Other Ambulatory Visit: Payer: Self-pay | Admitting: Oncology

## 2018-11-30 ENCOUNTER — Telehealth: Payer: Self-pay | Admitting: *Deleted

## 2018-11-30 NOTE — Telephone Encounter (Signed)
Spoke to pt concerning sx decision. Pt has decided to move forward with lumpectomy. Dr. Lucia Gaskins and staff notified.

## 2018-12-03 ENCOUNTER — Other Ambulatory Visit (HOSPITAL_COMMUNITY): Payer: Self-pay | Admitting: Surgery

## 2018-12-03 DIAGNOSIS — Z170 Estrogen receptor positive status [ER+]: Secondary | ICD-10-CM

## 2018-12-03 DIAGNOSIS — C50911 Malignant neoplasm of unspecified site of right female breast: Secondary | ICD-10-CM

## 2018-12-10 ENCOUNTER — Encounter: Payer: Self-pay | Admitting: *Deleted

## 2018-12-11 ENCOUNTER — Other Ambulatory Visit: Payer: Self-pay

## 2018-12-11 ENCOUNTER — Encounter (HOSPITAL_BASED_OUTPATIENT_CLINIC_OR_DEPARTMENT_OTHER): Payer: Self-pay

## 2018-12-11 ENCOUNTER — Telehealth: Payer: Self-pay | Admitting: Hematology and Oncology

## 2018-12-11 NOTE — Telephone Encounter (Signed)
Scheduled appt per 10/22 sch message - pt is aware of appt date and time

## 2018-12-14 ENCOUNTER — Encounter: Payer: Self-pay | Admitting: *Deleted

## 2018-12-17 NOTE — Progress Notes (Signed)

## 2018-12-18 ENCOUNTER — Other Ambulatory Visit (HOSPITAL_COMMUNITY)
Admission: RE | Admit: 2018-12-18 | Discharge: 2018-12-18 | Disposition: A | Discharge status: Home / Self Care | Payer: PPO | Source: Ambulatory Visit | Attending: Surgery | Admitting: Surgery

## 2018-12-18 DIAGNOSIS — Z01812 Encounter for preprocedural laboratory examination: Secondary | ICD-10-CM | POA: Insufficient documentation

## 2018-12-18 DIAGNOSIS — Z20828 Contact with and (suspected) exposure to other viral communicable diseases: Secondary | ICD-10-CM | POA: Insufficient documentation

## 2018-12-19 LAB — NOVEL CORONAVIRUS, NAA (HOSP ORDER, SEND-OUT TO REF LAB; TAT 18-24 HRS): SARS-CoV-2, NAA: NOT DETECTED

## 2018-12-20 NOTE — H&P (Signed)
Ruthe Mannan  Location: [facility] Surgery Patient #: [id number] DOB: 15-Oct-1950 Undefined / Language: Cleophus Molt / Race: White Female  History of Present Illness   The patient is a 68 year old female who presents with a complaint of breast cancer.  The PCP is Dr. Lavone Orn  The patient was referred by Dr. Domenick Bookbinder  The pateint is at the Breast [facility] - Oncology is Drs. Philis Nettle  She is by herself. We got her Jazzma, [place], on the phone.  [The Covid-19 virus has disrupted normal medical care in [place] and across the nation. We have sometimes had to alter normal surgical/medical care to limit this epidemic and we have explained these changes to the patient.]  She has gotten annual mammograms. She was disappointed that this tumor was as large as it was in just one year. She has a prior history of DCIS in 1998 - but had no treatment beyond her left mastectomy. She has occasional achiness in her left axilla.  Mammograms: [facility] - 11/06/2018 - 2.8 cm right breast mass at 1 o'clock suspicious for cancer Biopsy: 11/11/2018 - right breast 1 o'clock ([id number]) - IDC, grade 2, ER - 95%, PR - 95%, Ki67 - 15%, Her2Neu - neg Family history of breast or ovarian cancer: She had breast cancer in 1998 On hormone therapy: No  I discussed the options for breast cancer treatment with the patient. The patient is at the [facility], which includes medical oncology and radiation oncology. I discussed the surgical options of lumpectomy vs. mastectomy. If mastectomy, there is the possibility of reconstruction. I discussed the options of lymph node biopsy. The treatment plan depends on the pathologic staging of the tumor and the patient's personal wishes. The risks of surgery include, but are not limited to, bleeding, infection, the need for further surgery, and nerve injury. The patient has been given  literature on the treatment of breast cancer.  Plan: 1. Lumpectomy (seed localization) vs mastectomy with reconstruction, with SLNBx - she is undecided and will call us about her plans, 2. Oncotype. 3. Rad Tx, 4. Antihormone tx, 5. Genetic consult  Past Medical History: 1. Right breast cancer 2. Left breast cancer - DCIS - 1998 [place] - Mastectomy, Stephanie Coup - reconstruction  3. had negative genetics about 2015 4. Colonoscopy - 2020.8 - [facility] 5. Open cholecystectomy - [place] She had an ERCP about 1987 6. Low back pain 7. GERD 8.  Repeat negative genetic test  Social History: Celesta Gentile - Nelli Swalley (widowed - her husband died of metastatic melanoma about 2017)  She is getting married in [place] this Saturday, 12/05/2018 2 children: Ivin Poot, 12 yo in [place] is a nurse, [place]  She knows [employer] Retired Programme researcher, broadcasting/film/video   Past Surgical History Tawni Pummel, RN; 11/18/2018 7:40 AM) Breast Biopsy  Right. Breast Reconstruction  Left. Colon Polyp Removal - Open  Colon Removal - Partial  Gallbladder Surgery - Open  Mastectomy  Left.  Diagnostic Studies History Tawni Pummel, RN; 11/18/2018 7:40 AM) Colonoscopy  within last year Mammogram  within last year Pap Smear  1-5 years ago  Medication History Tawni Pummel, RN; 11/18/2018 7:40 AM) Medications Reconciled  Social History Tawni Pummel, RN; 11/18/2018 7:40 AM) Alcohol use  Occasional alcohol use. Caffeine use  Coffee. No drug use  Tobacco use  Never smoker.  Family History Tawni Pummel, RN; 11/18/2018 7:40 AM) Colon Cancer  Father. Colon Polyps  Father. Diabetes Mellitus  Father. Heart Disease  Father. Hypertension  Father.  Pregnancy / Birth History Tawni Pummel, RN; 11/18/2018 7:40 AM) Age at menarche  46 years. Age of menopause  51-55 Contraceptive History  Oral contraceptives. Gravida  2 Maternal age   50-30 Para  2 Regular periods   Other Problems Tawni Pummel, RN; 11/18/2018 7:40 AM) Back Pain  Breast Cancer  Cholelithiasis  Gastroesophageal Reflux Disease  Hemorrhoids  Hepatitis  Lump In Breast     Review of Systems Sunday Spillers Ledford RN; 11/18/2018 7:40 AM) General Not Present- Appetite Loss, Chills, Fatigue, Fever, Night Sweats, Weight Gain and Weight Loss. Skin Present- Dryness. Not Present- Change in Wart/Mole, Hives, Jaundice, New Lesions, Non-Healing Wounds, Rash and Ulcer. HEENT Present- Seasonal Allergies. Not Present- Earache, Hearing Loss, Hoarseness, Nose Bleed, Oral Ulcers, Ringing in the Ears, Sinus Pain, Sore Throat, Visual Disturbances, Wears glasses/contact lenses and Yellow Eyes. Respiratory Present- Snoring. Not Present- Bloody sputum, Chronic Cough, Difficulty Breathing and Wheezing. Breast Present- Breast Mass and Breast Pain. Not Present- Nipple Discharge and Skin Changes. Cardiovascular Present- Leg Cramps. Not Present- Chest Pain, Difficulty Breathing Lying Down, Palpitations, Rapid Heart Rate, Shortness of Breath and Swelling of Extremities. Gastrointestinal Present- Constipation, Excessive gas, Hemorrhoids and Indigestion. Not Present- Abdominal Pain, Bloating, Bloody Stool, Change in Bowel Habits, Chronic diarrhea, Difficulty Swallowing, Gets full quickly at meals, Nausea, Rectal Pain and Vomiting. Female Genitourinary Present- Frequency and Urgency. Not Present- Nocturia, Painful Urination and Pelvic Pain. Musculoskeletal Present- Back Pain, Joint Pain and Joint Stiffness. Not Present- Muscle Pain, Muscle Weakness and Swelling of Extremities. Neurological Present- Decreased Memory. Not Present- Fainting, Headaches, Numbness, Seizures, Tingling, Tremor, Trouble walking and Weakness. Psychiatric Not Present- Anxiety, Bipolar, Change in Sleep Pattern, Depression, Fearful and Frequent crying. Endocrine Present- Hot flashes. Not Present- Cold Intolerance,  Excessive Hunger, Hair Changes, Heat Intolerance and New Diabetes. Hematology Not Present- Blood Thinners, Easy Bruising, Excessive bleeding, Gland problems, HIV and Persistent Infections.   Physical Exam  General: WN WF who is alert and generally healthy appearing. HEENT: Normal. Pupils equal.  Neck: Supple. No mass. No thyroid mass. Lymph Nodes: No supraclavicular or cervical nodes.  Lungs: Clear to auscultation and symmetric breath sounds. Heart: RRR. No murmur or rub.  Breast: Right - 2 cm mass at 1 o'clock about 3 cm off the edge of the areola  Left - no mass  Abdomen: Soft. No mass. No tenderness. No hernia. Normal bowel sounds.  RUQ scar and TRAM scar Rectal: Not done.  Extremities: Good strength and ROM in upper and lower extremities.  Neurologic: Grossly intact to motor and sensory function. Psychiatric: Has normal mood and affect. Behavior is normal.    Assessment & Plan  1.  BREAST CANCER, STAGE 2, RIGHT (C50.911)  Story: 11/11/2018 - right breast 1 o'clock ([id number]) - IDC, grade 2, ER - 95%, PR - 95%, Ki67 - 15%, Her2Neu - neg  Oncology - Lindi Adie and Squire  Plan:   1. Lumpectomy (seed localization) vs mastectomy with reconstruction, with SLNBx,   She decided on a lumpectomy (seed loc)    2. Oncotype,  3. Rad Tx,  4. Antihormone tx  2. Left breast cancer - DCIS - 1998 [place] - Mastectomy, Stephanie Coup - reconstruction 3. had negative genetics about 2015 4. Low back pain 5. GERD 6.  She got married in [place] Saturday, 12/05/2018  Alphonsa Overall, MD, [facility] Surgery Office phone:  [phone]

## 2018-12-21 ENCOUNTER — Other Ambulatory Visit (HOSPITAL_COMMUNITY): Payer: Self-pay | Admitting: Surgery

## 2018-12-21 ENCOUNTER — Ambulatory Visit (HOSPITAL_COMMUNITY): Payer: PPO

## 2018-12-21 DIAGNOSIS — Z170 Estrogen receptor positive status [ER+]: Secondary | ICD-10-CM

## 2018-12-21 DIAGNOSIS — C50911 Malignant neoplasm of unspecified site of right female breast: Secondary | ICD-10-CM

## 2018-12-22 ENCOUNTER — Other Ambulatory Visit (HOSPITAL_COMMUNITY)
Admission: RE | Admit: 2018-12-22 | Discharge: 2018-12-22 | Disposition: A | Discharge status: Home / Self Care | Payer: PPO | Source: Ambulatory Visit | Attending: Surgery | Admitting: Surgery

## 2018-12-22 ENCOUNTER — Encounter (HOSPITAL_COMMUNITY): Payer: PPO

## 2018-12-22 DIAGNOSIS — Z01812 Encounter for preprocedural laboratory examination: Secondary | ICD-10-CM | POA: Diagnosis not present

## 2018-12-22 DIAGNOSIS — Z20828 Contact with and (suspected) exposure to other viral communicable diseases: Secondary | ICD-10-CM | POA: Insufficient documentation

## 2018-12-22 LAB — SARS CORONAVIRUS 2 (TAT 6-24 HRS): SARS Coronavirus 2: NEGATIVE

## 2018-12-23 NOTE — Progress Notes (Signed)
Informed patient to complete Ensure drink by 9:30 am on day of sx and arrive to center by 11:30

## 2018-12-24 ENCOUNTER — Encounter (HOSPITAL_COMMUNITY)
Admission: RE | Admit: 2018-12-24 | Discharge: 2018-12-24 | Disposition: A | Discharge status: Home / Self Care | Payer: PPO | Source: Ambulatory Visit | Attending: Surgery | Admitting: Surgery

## 2018-12-24 ENCOUNTER — Ambulatory Visit (HOSPITAL_BASED_OUTPATIENT_CLINIC_OR_DEPARTMENT_OTHER): Payer: PPO | Admitting: Certified Registered"

## 2018-12-24 ENCOUNTER — Ambulatory Visit
Admission: RE | Admit: 2018-12-24 | Discharge: 2018-12-24 | Disposition: A | Discharge status: Home / Self Care | Payer: PPO | Source: Ambulatory Visit | Attending: Surgery | Admitting: Surgery

## 2018-12-24 ENCOUNTER — Encounter (HOSPITAL_BASED_OUTPATIENT_CLINIC_OR_DEPARTMENT_OTHER): Payer: Self-pay | Admitting: Emergency Medicine

## 2018-12-24 ENCOUNTER — Ambulatory Visit (HOSPITAL_BASED_OUTPATIENT_CLINIC_OR_DEPARTMENT_OTHER)
Admission: RE | Admit: 2018-12-24 | Discharge: 2018-12-24 | Disposition: A | Discharge status: Home / Self Care | Payer: PPO | Attending: Surgery | Admitting: Surgery

## 2018-12-24 ENCOUNTER — Encounter (HOSPITAL_BASED_OUTPATIENT_CLINIC_OR_DEPARTMENT_OTHER)
Admission: RE | Disposition: A | Discharge status: Home / Self Care | Payer: Self-pay | Source: Home / Self Care | Attending: Surgery

## 2018-12-24 ENCOUNTER — Other Ambulatory Visit: Payer: Self-pay

## 2018-12-24 DIAGNOSIS — Z170 Estrogen receptor positive status [ER+]: Secondary | ICD-10-CM

## 2018-12-24 DIAGNOSIS — Z86000 Personal history of in-situ neoplasm of breast: Secondary | ICD-10-CM | POA: Insufficient documentation

## 2018-12-24 DIAGNOSIS — C50211 Malignant neoplasm of upper-inner quadrant of right female breast: Secondary | ICD-10-CM | POA: Diagnosis not present

## 2018-12-24 DIAGNOSIS — Z882 Allergy status to sulfonamides status: Secondary | ICD-10-CM | POA: Insufficient documentation

## 2018-12-24 DIAGNOSIS — K219 Gastro-esophageal reflux disease without esophagitis: Secondary | ICD-10-CM | POA: Insufficient documentation

## 2018-12-24 DIAGNOSIS — C50911 Malignant neoplasm of unspecified site of right female breast: Secondary | ICD-10-CM

## 2018-12-24 DIAGNOSIS — G8918 Other acute postprocedural pain: Secondary | ICD-10-CM | POA: Diagnosis not present

## 2018-12-24 HISTORY — PX: BREAST LUMPECTOMY WITH RADIOACTIVE SEED AND SENTINEL LYMPH NODE BIOPSY: SHX6550

## 2018-12-24 HISTORY — PX: BREAST LUMPECTOMY: SHX2

## 2018-12-24 SURGERY — BREAST LUMPECTOMY WITH RADIOACTIVE SEED AND SENTINEL LYMPH NODE BIOPSY
Anesthesia: General | Site: Breast | Laterality: Right

## 2018-12-24 MED ORDER — FENTANYL CITRATE (PF) 100 MCG/2ML IJ SOLN
INTRAMUSCULAR | Status: AC
  Filled 2018-12-24: qty 2

## 2018-12-24 MED ORDER — ACETAMINOPHEN 500 MG PO TABS
ORAL_TABLET | ORAL | Status: AC
  Filled 2018-12-24: qty 1

## 2018-12-24 MED ORDER — FENTANYL CITRATE (PF) 100 MCG/2ML IJ SOLN
50.0000 ug | INTRAMUSCULAR | Status: DC | PRN
  Administered 2018-12-24: 13:00:00 100 ug via INTRAVENOUS
  Administered 2018-12-24: 50 ug via INTRAVENOUS

## 2018-12-24 MED ORDER — CHLORHEXIDINE GLUCONATE CLOTH 2 % EX PADS: 6.0000 | MEDICATED_PAD | Freq: Once | CUTANEOUS | Status: DC

## 2018-12-24 MED ORDER — MIDAZOLAM HCL 2 MG/2ML IJ SOLN
1.0000 mg | INTRAMUSCULAR | Status: DC | PRN
  Administered 2018-12-24: 1 mg via INTRAVENOUS
  Administered 2018-12-24: 13:00:00 2 mg via INTRAVENOUS

## 2018-12-24 MED ORDER — GLYCOPYRROLATE 0.2 MG/ML IJ SOLN
INTRAMUSCULAR | Status: DC | PRN
  Administered 2018-12-24: 0.1 mg via INTRAVENOUS

## 2018-12-24 MED ORDER — EPHEDRINE SULFATE 50 MG/ML IJ SOLN
INTRAMUSCULAR | Status: DC | PRN
  Administered 2018-12-24: 10 mg via INTRAVENOUS
  Administered 2018-12-24: 15 mg via INTRAVENOUS

## 2018-12-24 MED ORDER — PROPOFOL 10 MG/ML IV BOLUS
INTRAVENOUS | Status: DC | PRN
  Administered 2018-12-24: 150 mg via INTRAVENOUS

## 2018-12-24 MED ORDER — ONDANSETRON HCL 4 MG/2ML IJ SOLN: 4.0000 mg | Freq: Once | INTRAMUSCULAR | Status: DC | PRN

## 2018-12-24 MED ORDER — BUPIVACAINE-EPINEPHRINE (PF) 0.5% -1:200000 IJ SOLN
INTRAMUSCULAR | Status: DC | PRN
  Administered 2018-12-24: 30 mL

## 2018-12-24 MED ORDER — DEXAMETHASONE SODIUM PHOSPHATE 10 MG/ML IJ SOLN
INTRAMUSCULAR | Status: DC | PRN
  Administered 2018-12-24: 10 mg via INTRAVENOUS

## 2018-12-24 MED ORDER — PHENYLEPHRINE HCL (PRESSORS) 10 MG/ML IV SOLN
INTRAVENOUS | Status: DC | PRN
  Administered 2018-12-24 (×2): 80 ug via INTRAVENOUS

## 2018-12-24 MED ORDER — HYDROMORPHONE HCL 1 MG/ML IJ SOLN: 0.2500 mg | INTRAMUSCULAR | Status: DC | PRN

## 2018-12-24 MED ORDER — HYDROCODONE-ACETAMINOPHEN 5-325 MG PO TABS: 1.0000 | ORAL_TABLET | Freq: Four times a day (QID) | ORAL | 0 refills | Status: DC | PRN

## 2018-12-24 MED ORDER — PROPOFOL 10 MG/ML IV BOLUS
INTRAVENOUS | Status: AC
  Filled 2018-12-24: qty 40

## 2018-12-24 MED ORDER — MIDAZOLAM HCL 2 MG/2ML IJ SOLN
INTRAMUSCULAR | Status: AC
  Filled 2018-12-24: qty 2

## 2018-12-24 MED ORDER — ACETAMINOPHEN 500 MG PO TABS
1000.0000 mg | ORAL_TABLET | ORAL | Status: AC
  Administered 2018-12-24: 1000 mg via ORAL

## 2018-12-24 MED ORDER — ONDANSETRON HCL 4 MG/2ML IJ SOLN
INTRAMUSCULAR | Status: DC | PRN
  Administered 2018-12-24: 4 mg via INTRAVENOUS

## 2018-12-24 MED ORDER — CEFAZOLIN SODIUM-DEXTROSE 1-4 GM/50ML-% IV SOLN
INTRAVENOUS | Status: AC
  Filled 2018-12-24: qty 50

## 2018-12-24 MED ORDER — MEPERIDINE HCL 25 MG/ML IJ SOLN: 6.2500 mg | INTRAMUSCULAR | Status: DC | PRN

## 2018-12-24 MED ORDER — BUPIVACAINE HCL (PF) 0.25 % IJ SOLN
INTRAMUSCULAR | Status: DC | PRN
  Administered 2018-12-24: 20 mL

## 2018-12-24 MED ORDER — LIDOCAINE 2% (20 MG/ML) 5 ML SYRINGE
INTRAMUSCULAR | Status: AC
  Filled 2018-12-24: qty 5

## 2018-12-24 MED ORDER — ONDANSETRON HCL 4 MG/2ML IJ SOLN
INTRAMUSCULAR | Status: AC
  Filled 2018-12-24: qty 2

## 2018-12-24 MED ORDER — CEFAZOLIN SODIUM-DEXTROSE 2-4 GM/100ML-% IV SOLN
2.0000 g | INTRAVENOUS | Status: AC
  Administered 2018-12-24: 2 g via INTRAVENOUS

## 2018-12-24 MED ORDER — LIDOCAINE HCL (CARDIAC) PF 100 MG/5ML IV SOSY
PREFILLED_SYRINGE | INTRAVENOUS | Status: DC | PRN
  Administered 2018-12-24: 100 mg via INTRAVENOUS

## 2018-12-24 MED ORDER — LACTATED RINGERS IV SOLN
INTRAVENOUS | Status: DC
  Administered 2018-12-24 (×2): via INTRAVENOUS

## 2018-12-24 MED ORDER — TECHNETIUM TC 99M SULFUR COLLOID FILTERED
0.5000 | Freq: Once | INTRAVENOUS | Status: AC | PRN
  Administered 2018-12-24: 0.5 via INTRADERMAL

## 2018-12-24 SURGICAL SUPPLY — 60 items
ADH SKN CLS APL DERMABOND .7 (GAUZE/BANDAGES/DRESSINGS) ×1
APL PRP STRL LF DISP 70% ISPRP (MISCELLANEOUS) ×2
APL SKNCLS STERI-STRIP NONHPOA (GAUZE/BANDAGES/DRESSINGS)
BENZOIN TINCTURE PRP APPL 2/3 (GAUZE/BANDAGES/DRESSINGS) IMPLANT
BINDER BREAST LRG (GAUZE/BANDAGES/DRESSINGS) IMPLANT
BINDER BREAST MEDIUM (GAUZE/BANDAGES/DRESSINGS) IMPLANT
BINDER BREAST XLRG (GAUZE/BANDAGES/DRESSINGS) IMPLANT
BINDER BREAST XXLRG (GAUZE/BANDAGES/DRESSINGS) IMPLANT
BLADE SURG 15 STRL LF DISP TIS (BLADE) ×1 IMPLANT
BLADE SURG 15 STRL SS (BLADE) ×2
CANISTER SUC SOCK COL 7IN (MISCELLANEOUS) IMPLANT
CANISTER SUCT 1200ML W/VALVE (MISCELLANEOUS) ×1 IMPLANT
CHLORAPREP W/TINT 26 (MISCELLANEOUS) ×3 IMPLANT
CLIP VESOCCLUDE SM WIDE 6/CT (CLIP) ×2 IMPLANT
COVER BACK TABLE REUSABLE LG (DRAPES) ×2 IMPLANT
COVER MAYO STAND REUSABLE (DRAPES) ×2 IMPLANT
COVER PROBE W GEL 5X96 (DRAPES) ×2 IMPLANT
COVER WAND RF STERILE (DRAPES) IMPLANT
DECANTER SPIKE VIAL GLASS SM (MISCELLANEOUS) ×1 IMPLANT
DERMABOND ADVANCED (GAUZE/BANDAGES/DRESSINGS) ×1
DERMABOND ADVANCED .7 DNX12 (GAUZE/BANDAGES/DRESSINGS) ×1 IMPLANT
DRAPE HALF SHEET 70X43 (DRAPES) ×2 IMPLANT
DRAPE LAPAROSCOPIC ABDOMINAL (DRAPES) ×2 IMPLANT
DRAPE UTILITY XL STRL (DRAPES) ×2 IMPLANT
DRSG PAD ABDOMINAL 8X10 ST (GAUZE/BANDAGES/DRESSINGS) ×1 IMPLANT
ELECT COATED BLADE 2.86 ST (ELECTRODE) ×2 IMPLANT
ELECT REM PT RETURN 9FT ADLT (ELECTROSURGICAL) ×2
ELECTRODE REM PT RTRN 9FT ADLT (ELECTROSURGICAL) ×1 IMPLANT
GAUZE SPONGE 4X4 12PLY STRL (GAUZE/BANDAGES/DRESSINGS) ×3 IMPLANT
GLOVE BIO SURGEON STRL SZ 6.5 (GLOVE) ×1 IMPLANT
GLOVE BIOGEL PI IND STRL 6.5 (GLOVE) IMPLANT
GLOVE BIOGEL PI IND STRL 7.0 (GLOVE) IMPLANT
GLOVE BIOGEL PI INDICATOR 6.5 (GLOVE) ×1
GLOVE BIOGEL PI INDICATOR 7.0 (GLOVE) ×1
GLOVE SURG SYN 7.5  E (GLOVE) ×2
GLOVE SURG SYN 7.5 E (GLOVE) ×2 IMPLANT
GLOVE SURG SYN 7.5 PF PI (GLOVE) ×2 IMPLANT
GOWN STRL REUS W/ TWL LRG LVL3 (GOWN DISPOSABLE) ×1 IMPLANT
GOWN STRL REUS W/ TWL XL LVL3 (GOWN DISPOSABLE) ×1 IMPLANT
GOWN STRL REUS W/TWL LRG LVL3 (GOWN DISPOSABLE) ×2
GOWN STRL REUS W/TWL XL LVL3 (GOWN DISPOSABLE) ×2
KIT MARKER MARGIN INK (KITS) ×2 IMPLANT
NDL HYPO 25X1 1.5 SAFETY (NEEDLE) ×1 IMPLANT
NDL SAFETY ECLIPSE 18X1.5 (NEEDLE) IMPLANT
NEEDLE HYPO 18GX1.5 SHARP (NEEDLE)
NEEDLE HYPO 25X1 1.5 SAFETY (NEEDLE) ×2 IMPLANT
NS IRRIG 1000ML POUR BTL (IV SOLUTION) ×2 IMPLANT
PACK BASIN DAY SURGERY FS (CUSTOM PROCEDURE TRAY) ×2 IMPLANT
PENCIL BUTTON HOLSTER BLD 10FT (ELECTRODE) ×2 IMPLANT
SLEEVE SCD COMPRESS KNEE MED (MISCELLANEOUS) ×2 IMPLANT
SPONGE LAP 18X18 RF (DISPOSABLE) ×2 IMPLANT
STRIP CLOSURE SKIN 1/2X4 (GAUZE/BANDAGES/DRESSINGS) IMPLANT
SUT MNCRL AB 4-0 PS2 18 (SUTURE) ×2 IMPLANT
SUT VICRYL 3-0 CR8 SH (SUTURE) ×3 IMPLANT
SYR BULB 3OZ (MISCELLANEOUS) ×1 IMPLANT
SYR CONTROL 10ML LL (SYRINGE) ×2 IMPLANT
TOWEL GREEN STERILE FF (TOWEL DISPOSABLE) ×2 IMPLANT
TRAY FAXITRON CT DISP (TRAY / TRAY PROCEDURE) ×2 IMPLANT
TUBE CONNECTING 20X1/4 (TUBING) ×2 IMPLANT
YANKAUER SUCT BULB TIP NO VENT (SUCTIONS) ×2 IMPLANT

## 2018-12-24 NOTE — Op Note (Addendum)
12/24/2018  3:36 PM  PATIENT:  Ariel Johnson DOB: 1950-07-21 MRN: [id number]  PREOP DIAGNOSIS:   right breast cancer  POSTOP DIAGNOSIS:    Right breast cancer, 1 o'clock position (T2, N0)  PROCEDURE:   Procedure(s):RIGHT BREAST LUMPECTOMY WITH RADIOACTIVE SEED AND RIGHT AXILLARY SENTINEL LYMPH NODE BIOPSY, deep sentinel lymph node biopsy  SURGEON:   Alphonsa Overall, M.D.  ANESTHESIA:   General  Anesthesiologist: Lillia Abed, MD CRNA: Willa Frater, CRNA  General  EBL:  59  ml  DRAINS:  none   LOCAL MEDICATIONS USED:   20 cc 1/4% marcaine, right pectoral block by anesthesia  SPECIMEN:   Right breast lumpectomy (6 color paint kit), right axillary sentinel lymph node (counts 190, background - 15)  COUNTS CORRECT:  YES  INDICATIONS FOR PROCEDURE:  Ariel Johnson is a 68 y.o. (DOB: [birth year]) white female whose primary care physician is Lavone Orn, MD and comes for right breast lumpectomy and right axillary sentinel lymph node biopsy.   She was seen with Drs. Lindi Adie and Squire in the Breast [facility].  The options for breast cancer treatment have been discussed with the patient. She elected to proceed with lumpectomy and axillary sentinel lymph node.     The indications and potential complications of surgery were explained to the patient. Potential complications include, but are not limited to, bleeding, infection, the need for further surgery, and nerve injury.     She had a I131 seed placed on 12/24/2018 in her right breast at [facility].  The seed is in the 1 o'clock position of the right breast.   In the holding area, her right areola was injected with 1 millicurie of Technitium Sulfur Colloid.  OPERATIVE NOTE:   The patient was taken to operating room # 1 at [facility] where she underwent a general anesthesia  supervised by Anesthesiologist: Lillia Abed, MD CRNA: Willa Frater, CRNA. Her right breast and axilla were prepped with  ChloraPrep and sterilely  draped.    A time-out was held and the surgical check list was reviewed.    The cancer was about at the 1 o'clock position of the right breast.   It was 3-4 cm from the areola.   The tumor was fairly large, so I decided go make may incision directly over the cancer.  I included an ellipse of skin with the specimen.  I used the Neoprobe to identify the I131 seed.  I tried to excise an area around the tumor of at least 1 cm.    I excised this block of breast tissue approximately 4 cm by 4 cm  in diameter.  I took the lumpectomy down to the chest wall.   I painted the lumpectomy specimen with the 6 color paint kit and did a specimen mammogram which confirmed the mass, clip, and the seed were all in the right position in the specimen.  The specimen was sent to pathology who called back to confirm that they have the seed and the specimen.   I then started the right deep axillary sentinel lymph node biopsy. I made an incision in the right axilla.  I found a hot area at the junction of the breast and the pectoralis major muscle, deep in the axilla. I cut down and  identified a hot node that had counts of 190 and the background has 15 counts. I checked her internal mammary nodes and supraclavicular nodes with the neoprobe and found no other hot area.  The axillary node was then sent to pathology.    I then irrigated the wound with saline. I infiltrated approximately 20 mL of 1/4% Marcaine between the incisions. I placed 4 clips to mark biopsy cavity, at 12, 3, 6, and 9 o'clock.  I then closed all the wounds in layers using 3-0 Vicryl sutures for the deep layer. At the skin, I closed the incisions with a 4-0 Monocryl suture. The incisions were then painted with Dermabond.  She had gauze place over the wounds and placed in a breast binder.   The patient tolerated the procedure well, was transported to the recovery room in good condition. Sponge and needle count were correct at the end of the case.   Final pathology  is pending.   Right breast lumpectomy  Alphonsa Overall, MD, [facility] Pager: [phone] Office phone:  [phone]

## 2018-12-24 NOTE — Anesthesia Procedure Notes (Signed)
Procedure Name: LMA Insertion Date/Time: 12/24/2018 2:02 PM Performed by: Willa Frater, CRNA Pre-anesthesia Checklist: Patient identified, Emergency Drugs available, Suction available and Patient being monitored Patient Re-evaluated:Patient Re-evaluated prior to induction Oxygen Delivery Method: Circle system utilized Preoxygenation: Pre-oxygenation with 100% oxygen Induction Type: IV induction Ventilation: Mask ventilation without difficulty LMA: LMA inserted LMA Size: 4.0 Number of attempts: 1 Airway Equipment and Method: Bite block Placement Confirmation: positive ETCO2 Tube secured with: Tape Dental Injury: Teeth and Oropharynx as per pre-operative assessment

## 2018-12-24 NOTE — Interval H&P Note (Signed)
History and Physical Interval Note:  12/24/2018 1:32 PM  Ariel Johnson  has presented today for surgery, with the diagnosis of right breast cancer.  The various methods of treatment have been discussed with the patient and family.   Seed in place and husband here with patient.  After consideration of risks, benefits and other options for treatment, the patient has consented to  Procedure(s): RIGHT BREAST LUMPECTOMY WITH RADIOACTIVE SEED AND RIGHT AXILLARY SENTINEL LYMPH NODE BIOPSY (Right) as a surgical intervention.  The patient's history has been reviewed, patient examined, no change in status, stable for surgery.  I have reviewed the patient's chart and labs.  Questions were answered to the patient's satisfaction.     Shann Medal

## 2018-12-24 NOTE — Discharge Instructions (Signed)
[facility] - DISCHARGE INSTRUCTIONS TO PATIENT  Activity:  Driving - May drive in 2 to 4 days, if you are off pain meds and doing well.   Lifting - No lifting more than 15 pounds for 1 week, then no limit                       Practice your Covid-19 protection:  Wear a mask, social distance, and wash your hands frequently  Wound Care:   Leave the incision dry for 2 days, then you may shower.   You may wash the wounds with soap and water.  Diet:  As tolerated  Follow up appointment:  Call Dr. Pollie Friar office [facility]) at [phone] for an appointment in 1 to 2 weeks..  Medications and dosages:  Resume your home medications.  You have a prescription for:  vicodin  Call Dr. Lucia Gaskins or his office  [phone]) if you have:  Temperature greater than 100.4,  Persistent nausea and vomiting,  Severe uncontrolled pain,  Redness, tenderness, or signs of infection (pain, swelling, redness, odor or green/yellow discharge around the site),  Difficulty breathing, headache or visual disturbances,  Any other questions or concerns you may have after discharge.  In an emergency, call 911 or go to an Emergency Department at a nearby hospital.     Post Anesthesia Home Care Instructions  Activity: Get plenty of rest for the remainder of the day. A responsible individual must stay with you for 24 hours following the procedure.  For the next 24 hours, DO NOT: -Drive a car -Paediatric nurse -Drink alcoholic beverages -Take any medication unless instructed by your physician -Make any legal decisions or sign important papers.  Meals: Start with liquid foods such as gelatin or soup. Progress to regular foods as tolerated. Avoid greasy, spicy, heavy foods. If nausea and/or vomiting occur, drink only clear liquids until the nausea and/or vomiting subsides. Call your physician if vomiting continues.  Special Instructions/Symptoms: Your throat may feel dry or  sore from the anesthesia or the breathing tube placed in your throat during surgery. If this causes discomfort, gargle with warm salt water. The discomfort should disappear within 24 hours.  If you had a scopolamine patch placed behind your ear for the management of post- operative nausea and/or vomiting:  1. The medication in the patch is effective for 72 hours, after which it should be removed.  Wrap patch in a tissue and discard in the trash. Wash hands thoroughly with soap and water. 2. You may remove the patch earlier than 72 hours if you experience unpleasant side effects which may include dry mouth, dizziness or visual disturbances. 3. Avoid touching the patch. Wash your hands with soap and water after contact with the patch.    Call your surgeon if you experience:   1.  Fever over 101.0. 2.  Inability to urinate. 3.  Nausea and/or vomiting. 4.  Extreme swelling or bruising at the surgical site. 5.  Continued bleeding from the incision. 6.  Increased pain, redness or drainage from the incision. 7.  Problems related to your pain medication. 8.  Any problems and/or concerns

## 2018-12-24 NOTE — Anesthesia Procedure Notes (Signed)
Performed by: Ashkan Chamberland D, CRNA       

## 2018-12-24 NOTE — Transfer of Care (Signed)
Immediate Anesthesia Transfer of Care Note  Patient: Ariel Johnson  Procedure(s) Performed: RIGHT BREAST LUMPECTOMY WITH RADIOACTIVE SEED AND RIGHT AXILLARY SENTINEL LYMPH NODE BIOPSY (Right Breast)  Patient Location: PACU  Anesthesia Type:GA combined with regional for post-op pain  Level of Consciousness: awake, alert , oriented and drowsy  Airway & Oxygen Therapy: Patient Spontanous Breathing and Patient connected to face mask oxygen  Post-op Assessment: Report given to RN and Post -op Vital signs reviewed and stable  Post vital signs: Reviewed and stable  Last Vitals:  Vitals Value Taken Time  BP    Temp    Pulse 89 12/24/18 1548  Resp 10 12/24/18 1548  SpO2 99 % 12/24/18 1548  Vitals shown include unvalidated device data.  Last Pain:  Vitals:   12/24/18 1204  TempSrc: Oral  PainSc: 0-No pain         Complications: No apparent anesthesia complications

## 2018-12-24 NOTE — Anesthesia Postprocedure Evaluation (Signed)
Anesthesia Post Note  Patient: Ariel Johnson  Procedure(s) Performed: RIGHT BREAST LUMPECTOMY WITH RADIOACTIVE SEED AND RIGHT AXILLARY SENTINEL LYMPH NODE BIOPSY (Right Breast)     Patient location during evaluation: PACU Anesthesia Type: General Level of consciousness: awake and alert Pain management: pain level controlled Vital Signs Assessment: post-procedure vital signs reviewed and stable Respiratory status: spontaneous breathing, nonlabored ventilation, respiratory function stable and patient connected to nasal cannula oxygen Cardiovascular status: blood pressure returned to baseline and stable Postop Assessment: no apparent nausea or vomiting Anesthetic complications: no    Last Vitals:  Vitals:   12/24/18 1558 12/24/18 1600  BP:  131/64  Pulse: 78 63  Resp: 14 10  Temp:    SpO2: 100% 100%    Last Pain:  Vitals:   12/24/18 1550  TempSrc:   PainSc: 0-No pain                 Lilymae Swiech DAVID

## 2018-12-24 NOTE — Anesthesia Preprocedure Evaluation (Signed)
Anesthesia Evaluation  Patient identified by MRN, date of birth, ID band Patient awake    Reviewed: Allergy & Precautions, NPO status , Patient's Chart, lab work & pertinent test results  Airway Mallampati: I  TM Distance: >3 FB Neck ROM: Full    Dental   Pulmonary    Pulmonary exam normal        Cardiovascular Normal cardiovascular exam     Neuro/Psych    GI/Hepatic GERD  Medicated and Controlled,  Endo/Other    Renal/GU      Musculoskeletal   Abdominal   Peds  Hematology   Anesthesia Other Findings   Reproductive/Obstetrics                             Anesthesia Physical Anesthesia Plan  ASA: II  Anesthesia Plan: General   Post-op Pain Management:  Regional for Post-op pain   Induction: Intravenous  PONV Risk Score and Plan: 3 and Midazolam, Ondansetron and Dexamethasone  Airway Management Planned: LMA  Additional Equipment:   Intra-op Plan:   Post-operative Plan: Extubation in OR  Informed Consent: I have reviewed the patients History and Physical, chart, labs and discussed the procedure including the risks, benefits and alternatives for the proposed anesthesia with the patient or authorized representative who has indicated his/her understanding and acceptance.       Plan Discussed with: CRNA and Surgeon  Anesthesia Plan Comments:         Anesthesia Quick Evaluation

## 2018-12-24 NOTE — Anesthesia Procedure Notes (Signed)
Anesthesia Regional Block: Pectoralis block   Pre-Anesthetic Checklist: ,, timeout performed, Correct Patient, Correct Site, Correct Laterality, Correct Procedure, Correct Position, site marked, Risks and benefits discussed,  Surgical consent,  Pre-op evaluation,  At surgeon's request and post-op pain management  Laterality: Right  Prep: chloraprep       Needles:  Injection technique: Single-shot     Needle Length: 9cm  Needle Gauge: 21     Additional Needles:   Narrative:  Start time: 12/24/2018 12:45 PM End time: 12/24/2018 12:55 PM Injection made incrementally with aspirations every 5 mL.  Performed by: Personally  Anesthesiologist: Lillia Abed, MD  Additional Notes: Monitors applied. Patient sedated. Sterile prep and drape,hand hygiene and sterile gloves were used. Relevant anatomy identified.Needle position confirmed.Local anesthetic injected incrementally after negative aspiration. Local anesthetic spread visualized. Vascular puncture avoided. No complications. Image printed for medical record.The patient tolerated the procedure well.

## 2018-12-24 NOTE — Progress Notes (Signed)
Assisted Dr. Conrad  with right, ultrasound guided, axillary block. Side rails up, monitors on throughout procedure. See vital signs in flow sheet. Tolerated Procedure well.

## 2018-12-25 ENCOUNTER — Encounter (HOSPITAL_BASED_OUTPATIENT_CLINIC_OR_DEPARTMENT_OTHER): Payer: Self-pay | Admitting: Surgery

## 2018-12-28 LAB — SURGICAL PATHOLOGY

## 2018-12-29 ENCOUNTER — Telehealth: Payer: Self-pay | Admitting: *Deleted

## 2018-12-29 NOTE — Telephone Encounter (Signed)
Received oncotype testing order. Requisition faxed to [facility] and pathology

## 2018-12-30 NOTE — Progress Notes (Addendum)
Patient Care Team: Lavone Orn, MD as PCP - General (Internal Medicine) Rockwell Germany, RN as Oncology Nurse Navigator Mauro Kaufmann, RN as Oncology Nurse Navigator Alphonsa Overall, MD as Consulting Physician (General Surgery) Nicholas Lose, MD as Consulting Physician (Hematology and Oncology) Eppie Gibson, MD as Attending Physician (Radiation Oncology)  DIAGNOSIS:    ICD-10-CM   1. Malignant neoplasm of upper-inner quadrant of right breast in female, estrogen receptor positive ([place])  C50.211    Z17.0     SUMMARY OF ONCOLOGIC HISTORY: Oncology History  Malignant neoplasm of upper-inner quadrant of right breast in female, estrogen receptor positive ([place])  11/13/2018 Initial Diagnosis   Routine screening mammogram detected a right breast mass, 2.6cm, at the 1 o'clock position. Biopsy showed IDC with DCIS, grade 2, HER-2 - (1+), ER+ 95%, PR+ 95%, Ki67 15%.   11/18/2018 Cancer Staging   Staging form: Breast, AJCC 8th Edition - Clinical stage from 11/18/2018: Stage IB (cT2, cN0, cM0, G2, ER+, PR+, HER2-) - Signed by Nicholas Lose, MD on 11/18/2018   11/25/2018 Genetic Testing   Negative genetic testing on the [facility] Breast Cancer STAT panel and the Common Hereditary Cancers panel. The report date is 11/25/2018.  The STAT Breast cancer panel offered by [facility] includes sequencing and rearrangement analysis for the following 9 genes:  ATM, BRCA1, BRCA2, CDH1, CHEK2, PALB2, PTEN, STK11 and TP53.  The Common Hereditary Cancers Panel offered by [facility] includes sequencing and/or deletion duplication testing of the following 48 genes: APC, ATM, AXIN2, BARD1, BMPR1A, BRCA1, BRCA2, BRIP1, CDH1, CDK4, CDKN2A (p14ARF), CDKN2A (p16INK4a), CHEK2, CTNNA1, DICER1, EPCAM (Deletion/duplication testing only), GREM1 (promoter region deletion/duplication testing only), KIT, MEN1, MLH1, MSH2, MSH3, MSH6, MUTYH, NBN, NF1, NHTL1, PALB2, PDGFRA, PMS2, POLD1, POLE, PTEN, RAD50, RAD51C, RAD51D, RNF43, SDHB, SDHC,  SDHD, SMAD4, SMARCA4. STK11, TP53, TSC1, TSC2, and VHL.  The following genes were evaluated for sequence changes only: SDHA and HOXB13 c.251G>A variant only.    12/24/2018 Surgery   Right lumpectomy Lucia Gaskins): IDC with DCIS, 3.1cm, grade 2, clear margins, 3 right axillary lymph nodes negative.  ER 95%, PR 95%, Ki-67 15%, HER-2 negative   12/31/2018 Cancer Staging   Staging form: Breast, AJCC 8th Edition - Pathologic stage from 12/31/2018: Stage IA (pT2, pN0, cM0, G2, ER+, PR+, HER2-) - Signed by Nicholas Lose, MD on 12/31/2018     CHIEF COMPLIANT: Follow-up s/p lumpectomy to review pathology  INTERVAL HISTORY: Ariel Johnson is a 68 y.o. with above-mentioned history of right breast cancer. Genetic testing was negative. She underwent a lumpectomy on 12/24/18 with Dr. Lucia Gaskins for which pathology showed invasive ductal carcinoma with DCIS, 3.1cm, grade 2, clear margins, 3 right axillary lymph nodes negative for carcinoma. She presents to the clinic today for follow-up to review the pathology report and discuss further treatment.  She is complaining of left calf pain and cramps for the past couple of days and is concerned about risk of blood clots.  REVIEW OF SYSTEMS:   Constitutional: Denies fevers, chills or abnormal weight loss Eyes: Denies blurriness of vision Ears, nose, mouth, throat, and face: Denies mucositis or sore throat Respiratory: Denies cough, dyspnea or wheezes Cardiovascular: Denies palpitation, chest discomfort Gastrointestinal: Denies nausea, heartburn or change in bowel habits Skin: Denies abnormal skin rashes Lymphatics: Denies new lymphadenopathy or easy bruising Neurological: Denies numbness, tingling or new weaknesses Behavioral/Psych: Mood is stable, no new changes  Extremities: Left leg cramps Breast: denies any pain or lumps or nodules in either breasts All other systems  were reviewed with the patient and are negative.  I have reviewed the past medical history,  past surgical history, social history and family history with the patient and they are unchanged from previous note.  ALLERGIES:  is allergic to sulfur.  MEDICATIONS:  Current Outpatient Medications  Medication Sig Dispense Refill  . Calcium Carbonate-Vit D-Min (CALTRATE 600+D PLUS PO) Take by mouth daily.    Marland Kitchen DOCUSATE SODIUM PO Take 50 mg by mouth as needed.    Marland Kitchen HYDROcodone-acetaminophen (NORCO/VICODIN) 5-325 MG tablet Take 1 tablet by mouth every 6 (six) hours as needed for moderate pain. 10 tablet 0  . Magnesium 250 MG TABS Take by mouth as needed.    . Multiple Vitamin (MULTIVITAMIN) tablet Take 1 tablet by mouth daily.    Marland Kitchen omeprazole (PRILOSEC) 20 MG capsule Take 20 mg by mouth daily.    . Probiotic Product (PROBIOTIC DAILY PO) Take by mouth.    . Turmeric (QC TUMERIC COMPLEX PO) Take 1,400 mg by mouth daily.    Marland Kitchen UNABLE TO FIND [place] Apple Cider Gummies   For Acid Reflux     No current facility-administered medications for this visit.     PHYSICAL EXAMINATION: ECOG PERFORMANCE STATUS: 1 - Symptomatic but completely ambulatory  Vitals:   12/31/18 1146  BP: 117/62  Pulse: 77  Resp: 18  Temp: 98.3 F (36.8 C)  SpO2: 97%   Filed Weights   12/31/18 1146  Weight: 151 lb 14.4 oz (68.9 kg)    GENERAL: alert, no distress and comfortable SKIN: skin color, texture, turgor are normal, no rashes or significant lesions EYES: normal, Conjunctiva are pink and non-injected, sclera clear OROPHARYNX: no exudate, no erythema and lips, buccal mucosa, and tongue normal  NECK: supple, thyroid normal size, non-tender, without nodularity LYMPH: no palpable lymphadenopathy in the cervical, axillary or inguinal LUNGS: clear to auscultation and percussion with normal breathing effort HEART: regular rate & rhythm and no murmurs and no lower extremity edema ABDOMEN: abdomen soft, non-tender and normal bowel sounds MUSCULOSKELETAL: no cyanosis of digits and no clubbing  NEURO: alert &  oriented x 3 with fluent speech, no focal motor/sensory deficits EXTREMITIES: No lower extremity edema  LABORATORY DATA:  I have reviewed the data as listed CMP Latest Ref Rng & Units 11/18/2018  Glucose 70 - 99 mg/dL 60(L)  BUN 8 - 23 mg/dL 15  Creatinine 0.44 - 1.00 mg/dL 0.84  Sodium 135 - 145 mmol/L 142  Potassium 3.5 - 5.1 mmol/L 3.6  Chloride 98 - 111 mmol/L 105  CO2 22 - 32 mmol/L 27  Calcium 8.9 - 10.3 mg/dL 9.3  Total Protein 6.5 - 8.1 g/dL 7.0  Total Bilirubin 0.3 - 1.2 mg/dL 0.7  Alkaline Phos 38 - 126 U/L 87  AST 15 - 41 U/L 21  ALT 0 - 44 U/L 18    Lab Results  Component Value Date   WBC 5.4 11/18/2018   HGB 12.9 11/18/2018   HCT 39.7 11/18/2018   MCV 92.1 11/18/2018   PLT 165 11/18/2018   NEUTROABS 3.0 11/18/2018    ASSESSMENT & PLAN:  Malignant neoplasm of upper-inner quadrant of right breast in female, estrogen receptor positive ([place]) 11/13/2018:Routine screening mammogram detected a right breast mass, 2.6cm, at the 1 o'clock position. Biopsy showed IDC with DCIS, grade 2, HER-2 - (1+), ER+ 95%, PR+ 95%, Ki67 15%. T2N0 stage Ib Prior history of left mastectomy 1998  Treatment plan: 1. Breast conserving surgery followed by 2. Oncotype DX testing to  determine if chemotherapy would be of any benefit followed by 3. Adjuvant radiation therapy if she undergoes breast conserving surgery 4. Adjuvant antiestrogen therapy with anastrozole 1 mg daily x5 to 7 years --------------------------------------------------------------------------------------------------------------------------- 12/24/2018:Right lumpectomy Lucia Gaskins): IDC with DCIS, 3.1cm, grade 2, clear margins, 3 right axillary lymph nodes negative.  T2 N0 stage Ib  Pathology counseling: I discussed the final pathology report of the patient provided  a copy of this report. I discussed the margins as well as lymph node surgeries. We also discussed the final staging along with previously performed ER/PR and  HER-2/neu testing.  Left leg cramps: We will get an ultrasound of the leg to rule out DVT.  Return to clinic based upon Oncotype DX test result or after radiation.   No orders of the defined types were placed in this encounter.  The patient has a good understanding of the overall plan. she agrees with it. she will call with any problems that may develop before the next visit here.  Nicholas Lose, MD 12/31/2018  Julious Oka Dorshimer am acting as scribe for Dr. Nicholas Lose.  I have reviewed the above documentation for accuracy and completeness, and I agree with the above.   Addendum: Peroneal vein DVT detected in the left leg: Anticoagulation with Xarelto has been prescribed. I will see her back in 1 month for further evaluation.

## 2018-12-31 ENCOUNTER — Other Ambulatory Visit: Payer: Self-pay

## 2018-12-31 ENCOUNTER — Inpatient Hospital Stay: Payer: PPO | Attending: Hematology and Oncology | Admitting: Hematology and Oncology

## 2018-12-31 ENCOUNTER — Ambulatory Visit (HOSPITAL_COMMUNITY)
Admission: RE | Admit: 2018-12-31 | Discharge: 2018-12-31 | Disposition: A | Discharge status: Home / Self Care | Payer: PPO | Source: Ambulatory Visit | Attending: Hematology and Oncology | Admitting: Hematology and Oncology

## 2018-12-31 DIAGNOSIS — C50211 Malignant neoplasm of upper-inner quadrant of right female breast: Secondary | ICD-10-CM | POA: Insufficient documentation

## 2018-12-31 DIAGNOSIS — Z9012 Acquired absence of left breast and nipple: Secondary | ICD-10-CM | POA: Insufficient documentation

## 2018-12-31 DIAGNOSIS — M79662 Pain in left lower leg: Secondary | ICD-10-CM | POA: Diagnosis not present

## 2018-12-31 DIAGNOSIS — M7989 Other specified soft tissue disorders: Secondary | ICD-10-CM | POA: Diagnosis not present

## 2018-12-31 DIAGNOSIS — Z170 Estrogen receptor positive status [ER+]: Secondary | ICD-10-CM | POA: Insufficient documentation

## 2018-12-31 DIAGNOSIS — R252 Cramp and spasm: Secondary | ICD-10-CM | POA: Diagnosis not present

## 2018-12-31 MED ORDER — RIVAROXABAN (XARELTO) VTE STARTER PACK (15 & 20 MG): ORAL_TABLET | ORAL | 0 refills | Status: DC

## 2018-12-31 NOTE — Progress Notes (Signed)
VASCULAR LAB PRELIMINARY  PRELIMINARY  PRELIMINARY  PRELIMINARY  Left lower extremity venous duplex completed.    Preliminary report:  See CV proc for preliminary results.   Gave results to Dr. Benson Setting, [facility], RVT 12/31/2018, 1:31 PM

## 2018-12-31 NOTE — Assessment & Plan Note (Signed)
11/13/2018:Routine screening mammogram detected a right breast mass, 2.6cm, at the 1 o'clock position. Biopsy showed IDC with DCIS, grade 2, HER-2 - (1+), ER+ 95%, PR+ 95%, Ki67 15%. T2N0 stage Ib Prior history of left mastectomy 1998  Treatment plan: 1. Breast conserving surgery followed by 2. Oncotype DX testing to determine if chemotherapy would be of any benefit followed by 3. Adjuvant radiation therapy if she undergoes breast conserving surgery 4. Adjuvant antiestrogen therapy with anastrozole 1 mg daily x5 to 7 years --------------------------------------------------------------------------------------------------------------------------- 12/24/2018:Right lumpectomy Lucia Gaskins): IDC with DCIS, 3.1cm, grade 2, clear margins, 3 right axillary lymph nodes negative.  T2 N0 stage Ib  Pathology counseling: I discussed the final pathology report of the patient provided  a copy of this report. I discussed the margins as well as lymph node surgeries. We also discussed the final staging along with previously performed ER/PR and HER-2/neu testing.  Return to clinic based upon Oncotype DX test result or after radiation.

## 2018-12-31 NOTE — Addendum Note (Signed)
Addended by: Nicholas Lose on: 12/31/2018 01:33 PM   Modules accepted: Orders

## 2019-01-01 ENCOUNTER — Telehealth: Payer: Self-pay | Admitting: Hematology and Oncology

## 2019-01-01 NOTE — Telephone Encounter (Signed)
I could not reach regarding schedule  °

## 2019-01-04 ENCOUNTER — Telehealth: Payer: Self-pay | Admitting: *Deleted

## 2019-01-04 DIAGNOSIS — C50211 Malignant neoplasm of upper-inner quadrant of right female breast: Secondary | ICD-10-CM

## 2019-01-04 DIAGNOSIS — Z170 Estrogen receptor positive status [ER+]: Secondary | ICD-10-CM

## 2019-01-04 NOTE — Telephone Encounter (Signed)
Received oncotype results of 10/3%.  Patient is aware of these results.  Referral placed for Dr. Isidore Moos.

## 2019-01-07 ENCOUNTER — Encounter: Payer: Self-pay | Admitting: Physical Therapy

## 2019-01-07 ENCOUNTER — Ambulatory Visit: Payer: PPO | Attending: Surgery | Admitting: Physical Therapy

## 2019-01-07 ENCOUNTER — Other Ambulatory Visit: Payer: Self-pay

## 2019-01-07 DIAGNOSIS — Z483 Aftercare following surgery for neoplasm: Secondary | ICD-10-CM

## 2019-01-07 DIAGNOSIS — R293 Abnormal posture: Secondary | ICD-10-CM | POA: Insufficient documentation

## 2019-01-07 DIAGNOSIS — C50211 Malignant neoplasm of upper-inner quadrant of right female breast: Secondary | ICD-10-CM | POA: Diagnosis not present

## 2019-01-07 DIAGNOSIS — Z170 Estrogen receptor positive status [ER+]: Secondary | ICD-10-CM | POA: Diagnosis not present

## 2019-01-07 NOTE — Therapy (Signed)
[place], [place], [address] Phone: [phone]   Fax:  [phone]  Physical Therapy Treatment  Patient Details  Name: Ariel Johnson MRN: [id number] Date of Birth: [birth year] Referring Provider (PT): Dr. Alphonsa Overall   Encounter Date: 01/07/2019  PT End of Session - 01/08/19 0803    Visit Number  2    Number of Visits  2    Date for PT Re-Evaluation  03/04/19    PT Start Time  1028    PT Stop Time  1104    PT Time Calculation (min)  36 min    Activity Tolerance  Patient tolerated treatment well    Behavior During Therapy  [facility] for tasks assessed/performed       Past Medical History:  Diagnosis Date  . Ductal carcinoma in situ (DCIS) of left breast   . GERD (gastroesophageal reflux disease)   . Hepatitis   . History of cancer of left breast     Past Surgical History:  Procedure Laterality Date  . BREAST BIOPSY Right   . BREAST BIOPSY Right   . BREAST LUMPECTOMY WITH RADIOACTIVE SEED AND SENTINEL LYMPH NODE BIOPSY Right 12/24/2018   Procedure: RIGHT BREAST LUMPECTOMY WITH RADIOACTIVE SEED AND RIGHT AXILLARY SENTINEL LYMPH NODE BIOPSY;  Surgeon: Alphonsa Overall, MD;  Location: [facility];  Service: General;  Laterality: Right;  . CHOLECYSTECTOMY    . MASTECTOMY Left 1998  . PARTIAL COLECTOMY    . TUBAL LIGATION      There were no vitals filed for this visit.  Subjective Assessment - 01/07/19 1038    Subjective  Patient underwent right lumpmectomy and sentinel node biopsy (0/3 nodes positive) on 12/24/2018. Oncotype score was very low but she will have radiation next week.    Pertinent History  Patient was diagnosed on 10/29/2018 with right grade II invasive ductal carcinoma breast cancer. Patient underwent right lumpmectomy and sentinel node biopsy (0/3 nodes positive) on 12/24/2018. It is ER/PR positive and HER2 negative with a Ki67 of 15%. She has a history of left breast  cancer in 1998 with a mastectomy but it is unknown how many axillary nodes were removed. She reports never having left arm lymphedema.    Patient Stated Goals  See if my arm is ok    Currently in Pain?  Yes    Pain Score  2     Pain Location  Axilla    Pain Orientation  Right    Pain Descriptors / Indicators  Aching    Pain Type  Surgical pain    Pain Onset  1 to 4 weeks ago    Pain Frequency  Intermittent    Aggravating Factors   Doing too much    Pain Relieving Factors  Rest    Multiple Pain Sites  No            LYMPHEDEMA/ONCOLOGY QUESTIONNAIRE - 01/07/19 1046      Type   Cancer Type  Right breast cancer      Surgeries   Lumpectomy Date  12/24/18    Sentinel Lymph Node Biopsy Date  12/24/18    Number Lymph Nodes Removed  3      Treatment   Active Chemotherapy Treatment  No    Past Chemotherapy Treatment  No    Active Radiation Treatment  No    Past Radiation Treatment  No    Current Hormone Treatment  No    Past Hormone  Therapy  No      What other symptoms do you have   Are you Having Heaviness or Tightness  No    Are you having Pain  Yes    Are you having pitting edema  No    Is it Hard or Difficult finding clothes that fit  No    Do you have infections  No    Is there Decreased scar mobility  No    Stemmer Sign  No      Lymphedema Assessments   Lymphedema Assessments  Upper extremities      Right Upper Extremity Lymphedema   10 cm Proximal to Olecranon Process  28.3 cm    Olecranon Process  23.1 cm    10 cm Proximal to Ulnar Styloid Process  20.3 cm    Just Proximal to Ulnar Styloid Process  14.5 cm    Across Hand at [employer]  18.2 cm    At [place] of 2nd Digit  5.9 cm      Left Upper Extremity Lymphedema   10 cm Proximal to Olecranon Process  28.6 cm    Olecranon Process  23 cm    10 cm Proximal to Ulnar Styloid Process  20.9 cm    Just Proximal to Ulnar Styloid Process  14.6 cm    Across Hand at [employer]  18.4 cm    At [place] of 2nd  Digit  5.9 cm                             PT Long Term Goals - 01/07/19 1253      PT LONG TERM GOAL #1   Title  Patient will demonstrate she has regained shoulder ROM and function post operatively compared to baselines.    Time  8    Period  Weeks    Status  Achieved      PT LONG TERM GOAL #2   Title  Patient will report less tissue tightness and good mobility of scar.    Time  8    Period  Weeks    Status  New    Target Date  03/04/19            Plan - 01/07/19 1103    Clinical Impression Statement  Patient is doing very well s/p right lumpectomy and sentinel node biopsy on 12/24/2018. She had 3 negative nodes removed, had a low Oncotype score, and will begin radiation in 1-2 weeks. Her shoulder ROM is nearly back to baseline and I expect it to return to full ROM with adding 2 new exercises on to what she is already doing. She has some scar tissue in her right breast where the incision is located that is pulling her breast tissue inward. She was educated on doing gentle scar massage and encouraged to begin that in 1-2 weeks when she was completely healed and surgical glue has come off. She verbalized good understanding. Patient requested to schedule an appointment in 2 months in case her scar does not appear to be loosened. We agreed that we could re-examine her incisions in Jan. 2021 and begin scar massage if there was no improvement. Otherwise she has no need for PT at this time.    PT Frequency  --   1 additional visit in January 2021   PT Treatment/Interventions  ADLs/Self Care Home Management;Therapeutic exercise;Patient/family education;Scar mobilization    PT Next Visit Plan  Will reassess in January 2021 and begin scar massage if needed. Otherwise will D/C    PT Home Exercise Plan  Post op shoulder ROM HEP       Patient will benefit from skilled therapeutic intervention in order to improve the following deficits and impairments:  Postural dysfunction,  Decreased range of motion, Pain, Impaired UE functional use, Decreased knowledge of precautions, Increased fascial restricitons  Visit Diagnosis: Malignant neoplasm of upper-inner quadrant of right breast in female, estrogen receptor positive ([place]) - Plan: PT plan of care cert/re-cert  Abnormal posture - Plan: PT plan of care cert/re-cert  Aftercare following surgery for neoplasm - Plan: PT plan of care cert/re-cert     Problem List Patient Active Problem List   Diagnosis Date Noted  . Genetic testing 11/25/2018  . History of cancer of left breast   . Malignant neoplasm of upper-inner quadrant of right breast in female, estrogen receptor positive ([place]) 11/13/2018   Ariel Johnson, PT 01/08/19 8:04 AM  [place], [place], [address] Phone: [phone]   Fax:  [phone]  Name: APURVA REILY MRN: [id number] Date of Birth: 05/02/1950

## 2019-01-07 NOTE — Patient Instructions (Signed)
Closed Chain: Shoulder Flexion / Extension - on Wall    Hands on wall, step backward. Return. Stepping causes shoulder flexion and extension Do __5_ times, holding 5 seconds, _2-3__ times per day.  http://ss.exer.us/265   Copyright  VHI. All rights reserved.  Closed Chain: Shoulder Abduction / Adduction - on Wall    One hand on wall, step to side and return. Stepping causes shoulder to abduct and adduct. Do __5_ times, holding 5 seconds, _2-3__ times per day. http://ss.exer.us/267   Copyright  VHI. All rights reserved.

## 2019-01-08 NOTE — Progress Notes (Signed)
Location of Breast Cancer: Right Breast  Histology per Pathology Report:  11/11/18 Diagnosis Breast, right, needle core biopsy, 1 o'clock, ribbon clip - INVASIVE DUCTAL CARCINOMA. - DUCTAL CARCINOMA IN SITU.  Receptor Status: ER(95%), PR (95%), Her2-neu (NEG), Ki-(15%)  12/24/18 FINAL MICROSCOPIC DIAGNOSIS: A. BREAST, RIGHT, LUMPECTOMY: - Invasive ductal carcinoma, 3.1 cm, Nottingham grade 2 of 3. - Margins of resection are not involved (Closest margin: 10 mm, anterior). - Ductal carcinoma in situ. - See oncology table. B. SENTINEL LYMPH NODE, RIGHT AXILLARY, BIOPSY: - One lymph node, negative for carcinoma (0/1). C. SENTINEL LYMPH NODE, RIGHT AXILLARY, BIOPSY: - One lymph node, negative for carcinoma (0/1). D. SENTINEL LYMPH NODE, RIGHT AXILLARY, BIOPSY: - One lymph node, negative for carcinoma (0/1).  Did patient present with symptoms or was this found on screening mammography?: It was found on a screening mammogram.   Past/Anticipated interventions by surgeon, if any: 12/24/18 PROCEDURE:   Procedure(s):RIGHT BREAST LUMPECTOMY WITH RADIOACTIVE SEED AND RIGHT AXILLARY SENTINEL LYMPH NODE BIOPSY, deep sentinel lymph node biopsy SURGEON:   Ariel Johnson, M.D.  Past/Anticipated interventions by medical oncology, if any: 12/31/18 Dr. Lindi Johnson ASSESSMENT & PLAN:  Malignant neoplasm of upper-inner quadrant of right breast in female, estrogen receptor positive (Ariel Johnson) 11/13/2018:Routine screening mammogram detected a right breast mass, 2.6cm, at the 1 o'clock position. Biopsy showed IDC with DCIS, grade 2, HER-2 - (1+), ER+ 95%, PR+ 95%, Ki67 15%. T2N0 stage Ib Prior history of left mastectomy 1998 Treatment plan: 1. Breast conserving surgeryfollowed by 2. Oncotype DX testing to determine if chemotherapy would be of any benefit followed by 3. Adjuvant radiation therapyif she undergoes breast conserving surgery 4. Adjuvant antiestrogen therapywith anastrozole 1 mg daily x5 to 7  years --------------------------------------------------------------------------------------------------------------------------- 12/24/2018:Right lumpectomy Ariel Johnson): IDC with DCIS, 3.1cm, grade 2, clear margins, 3 right axillary lymph nodes negative.  T2 N0 stage Ib  Pathology counseling: I discussed the final pathology report of the patient provided  a copy of this report. I discussed the margins as well as lymph node surgeries. We also discussed the final staging along with previously performed ER/PR and HER-2/neu testing. Left leg cramps: We will get an ultrasound of the leg to rule out DVT. Return to clinic based upon Oncotype DX test result or after radiation.  Lymphedema issues, if any:  She denies. She has good arm mobility.   Pain issues, if any:  She denies.   SAFETY ISSUES:  Prior radiation? No  Pacemaker/ICD? No   Possible current pregnancy? No  Is the patient on methotrexate? No  Current Complaints / other details:   Left Breast Mastectomy 1998.    Ariel Johnson, Ariel Police, RN 01/08/2019,10:37 AM

## 2019-01-11 ENCOUNTER — Telehealth: Payer: Self-pay | Admitting: Radiation Oncology

## 2019-01-11 ENCOUNTER — Encounter: Payer: PPO | Admitting: Physical Therapy

## 2019-01-11 NOTE — Progress Notes (Signed)
Radiation Oncology         [phone]) [phone] ________________________________  Name: Ariel Johnson MRN: [id number]  Date: 01/12/2019  DOB: Aug 30, 1950  Follow-Up Visit Note by telephone as patient was unable to access MyChart video during pandemic precautions   Outpatient  CC: Lavone Orn, MD  Nicholas Lose, MD  Diagnosis:      ICD-10-CM   1. Malignant neoplasm of upper-inner quadrant of right breast in female, estrogen receptor positive ([place])  C50.211    Z17.0      Cancer Staging Malignant neoplasm of upper-inner quadrant of right breast in female, estrogen receptor positive ([place]) Staging form: Breast, AJCC 8th Edition - Clinical stage from 11/18/2018: Stage IB (cT2, cN0, cM0, G2, ER+, PR+, HER2-) - Signed by Nicholas Lose, MD on 11/18/2018 - Pathologic stage from 12/31/2018: Stage IA (pT2, pN0, cM0, G2, ER+, PR+, HER2-) - Signed by Nicholas Lose, MD on 12/31/2018   CHIEF COMPLAINT: Here to discuss management of right breast cancer  Narrative:  The patient returns today for follow-up to discuss radiation treatment options. She was seen in the multidisciplinary breast clinic on 11/18/2018.     Since consultation date, she underwent genetic testing on 11/26/2018, which was negative.    She opted to proceed with right lumpectomy with sentinel lymph node biopsy on date of 12/24/2018 with pathology report revealing: tumor size of 3.1 cm; histology of invasive ductal carcinoma; margin status to invasive disease of 10 mm and margin status to in situ disease of 10 mm; nodal status of negative (0/3); Grade 2. Prognostic panel not repeated.  Oncotype DX was obtained on the final surgical sample and the recurrence score of 10 predicts a risk of recurrence outside the breast over the next 9 years of 3%, if the patient's only systemic therapy is an antiestrogen for 5 years.  It also predicts no benefit from chemotherapy.  Symptomatically, the patient reports: healing well from surgery.  She  met with PT this week.         ALLERGIES:  is allergic to sulfamethoxazole and sulfur.  Meds: Current Outpatient Medications  Medication Sig Dispense Refill   Calcium Carbonate-Vit D-Min (CALTRATE 600+D PLUS PO) Take by mouth daily.     DOCUSATE SODIUM PO Take 50 mg by mouth as needed.     Magnesium 250 MG TABS Take by mouth as needed.     Multiple Vitamin (MULTIVITAMIN) tablet Take 1 tablet by mouth daily.     omeprazole (PRILOSEC) 20 MG capsule Take 20 mg by mouth daily.     Probiotic Product (PROBIOTIC DAILY PO) Take by mouth.     Rivaroxaban 15 & 20 MG TBPK Follow package directions: Take one 32m tablet by mouth twice a day. On day 22, switch to one 232mtablet once a day. Take with food. 51 each 0   Turmeric (QC TUMERIC COMPLEX PO) Take 1,400 mg by mouth daily.     UNABLE TO FIND [place] Apple Cider Gummies   For Acid Reflux     HYDROcodone-acetaminophen (NORCO/VICODIN) 5-325 MG tablet Take 1 tablet by mouth every 6 (six) hours as needed for moderate pain. (Patient not taking: Reported on 01/12/2019) 10 tablet 0   No current facility-administered medications for this encounter.     Physical Findings:  vitals were not taken for this visit. .     General: Alert and oriented, in no acute distress    Lab Findings: Lab Results  Component Value Date   WBC 5.4 11/18/2018  HGB 12.9 11/18/2018   HCT 39.7 11/18/2018   MCV 92.1 11/18/2018   PLT 165 11/18/2018      Radiographic Findings: Nm Sentinel Node Inj-no Rpt (breast)  Result Date: 12/24/2018 Sulfur colloid was injected by the nuclear medicine technologist for melanoma sentinel node.   Mm Breast Surgical Specimen  Result Date: 12/24/2018 CLINICAL DATA:  Post right breast lumpectomy. EXAM: SPECIMEN RADIOGRAPH OF THE RIGHT BREAST COMPARISON:  Previous exam(s). FINDINGS: Status post excision of the right breast. The radioactive seed and ribbon shaped and dumbbell shaped biopsy marker clips are present, completely  intact, and were marked for pathology. IMPRESSION: Specimen radiograph of the right breast. Electronically Signed   By: Everlean Alstrom M.D.   On: 12/24/2018 15:24   Mm Rt Radioactive Seed Loc Mammo Guide  Result Date: 12/24/2018 CLINICAL DATA:  Biopsy-proven RIGHT breast cancer scheduled for lumpectomy today requiring preoperative radioactive seed localization. EXAM: MAMMOGRAPHIC GUIDED RADIOACTIVE SEED LOCALIZATION OF THE RIGHT BREAST COMPARISON:  Previous exam(s). FINDINGS: Patient presents for radioactive seed localization prior to breast conservation surgery. I met with the patient and we discussed the procedure of seed localization including benefits and alternatives. We discussed the high likelihood of a successful procedure. We discussed the risks of the procedure including infection, bleeding, tissue injury and further surgery. We discussed the low dose of radioactivity involved in the procedure. Informed, written consent was given. The usual time-out protocol was performed immediately prior to the procedure. Using mammographic guidance, sterile technique, 1% lidocaine and an I-125 radioactive seed, the ribbon shaped clip within the outer RIGHT breast was localized using a medial approach. The follow-up mammogram images confirm the seed in the expected location and were marked for Dr. Lucia Gaskins. Follow-up survey of the patient confirms presence of the radioactive seed. Order number of I-125 seed:  [id number]. Total activity:  0.370 millicuries reference Date: 12/14/2018 The patient tolerated the procedure well and was released from the [facility]. She was given instructions regarding seed removal. IMPRESSION: Radioactive seed localization right breast. No apparent complications. Electronically Signed   By: Franki Cabot M.D.   On: 12/24/2018 11:16   Vas [place] Lower Extremity Venous (dvt)  Result Date: 12/31/2018  Lower Venous Study Indications: Pain, and Recent calf cramp.  Risk Factors: Cancer  Breast. Comparison Study: No prior study on file Performing Technologist: Sharion Dove RVS  Examination Guidelines: A complete evaluation includes B-mode imaging, spectral Doppler, color Doppler, and power Doppler as needed of all accessible portions of each vessel. Bilateral testing is considered an integral part of a complete examination. Limited examinations for reoccurring indications may be performed as noted.  +-----+---------------+---------+-----------+----------+--------------+  RIGHT Compressibility Phasicity Spontaneity Properties Thrombus Aging  +-----+---------------+---------+-----------+----------+--------------+  CFV   Full            Yes       Yes                                    +-----+---------------+---------+-----------+----------+--------------+   +---------+---------------+---------+-----------+----------+--------------+  LEFT      Compressibility Phasicity Spontaneity Properties Thrombus Aging  +---------+---------------+---------+-----------+----------+--------------+  CFV       Full            Yes       Yes                                    +---------+---------------+---------+-----------+----------+--------------+  SFJ       Full                                                             +---------+---------------+---------+-----------+----------+--------------+  FV Prox   Full                                                             +---------+---------------+---------+-----------+----------+--------------+  FV Mid    Full                                                             +---------+---------------+---------+-----------+----------+--------------+  FV Distal Full                                                             +---------+---------------+---------+-----------+----------+--------------+  PFV       Full                                                             +---------+---------------+---------+-----------+----------+--------------+  POP       Full             Yes       Yes                                    +---------+---------------+---------+-----------+----------+--------------+  PTV       Full                                                             +---------+---------------+---------+-----------+----------+--------------+  PERO      None                                             Acute           +---------+---------------+---------+-----------+----------+--------------+ DVT begins approximately 1 inch above ankle and extends approximately 3 inches into mid calf.    Summary: Right: No evidence of common femoral vein obstruction. Left: Findings consistent with acute deep vein thrombosis involving the left peroneal veins.  *See table(s) above for measurements and observations. Electronically signed by Servando Snare MD on 12/31/2018 at 3:24:00 PM.  Final     Impression/Plan: Right Breast Cancer  We reviewed her treatment thus far and discussed adjuvant radiotherapy today.  I recommend 4 weeks directed at the right breast in order to reduce the risk of locoregional recurrence by 2/3.  The risks, benefits and side effects of this treatment were discussed in detail.  She understands that radiotherapy is associated with skin irritation and fatigue in the acute setting. Late effects can include cosmetic changes and rare injury to internal organs.  She is enthusiastic about proceeding with treatment.  Simulation will occur today.   This encounter was provided by telemedicine platform by telephone as patient was unable to access MyChart video during pandemic precautions The patient has given verbal consent for this type of encounter and has been advised to only accept a meeting of this type in a secure network environment. The time spent during this encounter was 15 minutes. The attendants for this meeting include Eppie Gibson  and Letta Pate.  During the encounter, Eppie Gibson was located at [facility] Radiation Oncology Department.   Letta Pate was located at home.   _____________________________________   Eppie Gibson, MD   This document serves as a record of services personally performed by Eppie Gibson, MD. It was created on her behalf by Wilburn Mylar, a trained medical scribe. The creation of this record is based on the scribe's personal observations and the provider's statements to them. This document has been checked and approved by the attending provider.

## 2019-01-11 NOTE — Telephone Encounter (Signed)
Contacted patient to verify telephone visit for pre reg °

## 2019-01-12 ENCOUNTER — Other Ambulatory Visit: Payer: Self-pay

## 2019-01-12 ENCOUNTER — Encounter (HOSPITAL_COMMUNITY): Payer: Self-pay | Admitting: Hematology and Oncology

## 2019-01-12 ENCOUNTER — Ambulatory Visit
Admission: RE | Admit: 2019-01-12 | Discharge: 2019-01-12 | Disposition: A | Discharge status: Home / Self Care | Payer: PPO | Source: Ambulatory Visit | Attending: Radiation Oncology | Admitting: Radiation Oncology

## 2019-01-12 ENCOUNTER — Encounter: Payer: Self-pay | Admitting: Radiation Oncology

## 2019-01-12 DIAGNOSIS — Z170 Estrogen receptor positive status [ER+]: Secondary | ICD-10-CM | POA: Diagnosis not present

## 2019-01-12 DIAGNOSIS — Z9889 Other specified postprocedural states: Secondary | ICD-10-CM | POA: Diagnosis not present

## 2019-01-12 DIAGNOSIS — C50211 Malignant neoplasm of upper-inner quadrant of right female breast: Secondary | ICD-10-CM

## 2019-01-12 NOTE — Progress Notes (Signed)
  Radiation Oncology         [phone] ________________________________  Name: Ariel Johnson MRN: [id number]  Date: 01/12/2019  DOB: 1951/01/11  SIMULATION AND TREATMENT PLANNING NOTE    outpatient  DIAGNOSIS:     ICD-10-CM   1. Malignant neoplasm of upper-inner quadrant of right breast in female, estrogen receptor positive ([place])  C50.211    Z17.0     NARRATIVE:  The patient was brought to the [facility].  Identity was confirmed.  All relevant records and images related to the planned course of therapy were reviewed.  The patient freely provided informed written consent to proceed with treatment after reviewing the details related to the planned course of therapy. The consent form was witnessed and verified by the simulation staff.    Then, the patient was set-up in a stable reproducible supine position for radiation therapy with her ipsilateral arm over her head, and her upper body secured in a custom-made Vac-lok device.  CT images were obtained.  Surface markings were placed.  The CT images were loaded into the planning software.    TREATMENT PLANNING NOTE: Treatment planning then occurred.  The radiation prescription was entered and confirmed.     A total of 3 medically necessary complex treatment devices were fabricated and supervised by me: 2 fields with MLCs for custom blocks to protect heart, and lungs;  and, a Vac-lok. MORE COMPLEX DEVICES MAY BE MADE IN DOSIMETRY FOR FIELD IN FIELD BEAMS FOR DOSE HOMOGENEITY.  I have requested : 3D Simulation which is medically necessary to give adequate dose to at risk tissues while sparing lungs and heart.  I have requested a DVH of the following structures: lungs, heart, right lumpectomy cavity.    The patient will receive 40.05 Gy in 15 fractions to the right breast with 2 tangential fields.   This will be followed by a boost.  Optical Surface Tracking Plan:  Since intensity modulated radiotherapy (IMRT) and 3D  conformal radiation treatment methods are predicated on accurate and precise positioning for treatment, intrafraction motion monitoring is medically necessary to ensure accurate and safe treatment delivery. The ability to quantify intrafraction motion without excessive ionizing radiation dose can only be performed with optical surface tracking. Accordingly, surface imaging offers the opportunity to obtain 3D measurements of patient position throughout IMRT and 3D treatments without excessive radiation exposure. I am ordering optical surface tracking for this patient's upcoming course of radiotherapy.  ________________________________   Reference:  Ursula Alert, J, et al. Surface imaging-based analysis of intrafraction motion for breast radiotherapy patients.Journal of [place], n. 6, nov. 2014. ISSN [id number].  Available at: <http://www.jacmp.org/index.php/jacmp/article/view/4957>.    -----------------------------------  Eppie Gibson, MD

## 2019-01-13 DIAGNOSIS — C50211 Malignant neoplasm of upper-inner quadrant of right female breast: Secondary | ICD-10-CM | POA: Diagnosis not present

## 2019-01-18 ENCOUNTER — Encounter: Payer: Self-pay | Admitting: *Deleted

## 2019-01-18 ENCOUNTER — Telehealth: Payer: Self-pay | Admitting: Hematology and Oncology

## 2019-01-18 NOTE — Telephone Encounter (Signed)
Confirmed 12/22 visit with patient.

## 2019-01-21 ENCOUNTER — Other Ambulatory Visit: Payer: Self-pay

## 2019-01-21 ENCOUNTER — Ambulatory Visit
Admission: RE | Admit: 2019-01-21 | Discharge: 2019-01-21 | Disposition: A | Discharge status: Home / Self Care | Payer: PPO | Source: Ambulatory Visit | Attending: Radiation Oncology | Admitting: Radiation Oncology

## 2019-01-21 DIAGNOSIS — Z170 Estrogen receptor positive status [ER+]: Secondary | ICD-10-CM | POA: Insufficient documentation

## 2019-01-21 DIAGNOSIS — C50211 Malignant neoplasm of upper-inner quadrant of right female breast: Secondary | ICD-10-CM | POA: Insufficient documentation

## 2019-01-21 MED ORDER — SONAFINE EX EMUL
1.0000 "application " | Freq: Once | CUTANEOUS | Status: AC
  Administered 2019-01-21: 1 via TOPICAL

## 2019-01-21 MED ORDER — ALRA NON-METALLIC DEODORANT (RAD-ONC)
1.0000 "application " | Freq: Once | TOPICAL | Status: AC
  Administered 2019-01-21: 1 via TOPICAL

## 2019-01-21 NOTE — Progress Notes (Signed)

## 2019-01-22 ENCOUNTER — Ambulatory Visit
Admission: RE | Admit: 2019-01-22 | Discharge: 2019-01-22 | Disposition: A | Discharge status: Home / Self Care | Payer: PPO | Source: Ambulatory Visit | Attending: Radiation Oncology | Admitting: Radiation Oncology

## 2019-01-22 ENCOUNTER — Other Ambulatory Visit: Payer: Self-pay

## 2019-01-22 DIAGNOSIS — D2272 Melanocytic nevi of left lower limb, including hip: Secondary | ICD-10-CM | POA: Diagnosis not present

## 2019-01-22 DIAGNOSIS — L821 Other seborrheic keratosis: Secondary | ICD-10-CM | POA: Diagnosis not present

## 2019-01-22 DIAGNOSIS — D1801 Hemangioma of skin and subcutaneous tissue: Secondary | ICD-10-CM | POA: Diagnosis not present

## 2019-01-22 DIAGNOSIS — C50211 Malignant neoplasm of upper-inner quadrant of right female breast: Secondary | ICD-10-CM | POA: Diagnosis not present

## 2019-01-22 DIAGNOSIS — L814 Other melanin hyperpigmentation: Secondary | ICD-10-CM | POA: Diagnosis not present

## 2019-01-22 DIAGNOSIS — L570 Actinic keratosis: Secondary | ICD-10-CM | POA: Diagnosis not present

## 2019-01-22 DIAGNOSIS — D225 Melanocytic nevi of trunk: Secondary | ICD-10-CM | POA: Diagnosis not present

## 2019-01-22 DIAGNOSIS — D2262 Melanocytic nevi of left upper limb, including shoulder: Secondary | ICD-10-CM | POA: Diagnosis not present

## 2019-01-25 ENCOUNTER — Other Ambulatory Visit: Payer: Self-pay

## 2019-01-25 ENCOUNTER — Ambulatory Visit
Admission: RE | Admit: 2019-01-25 | Discharge: 2019-01-25 | Disposition: A | Discharge status: Home / Self Care | Payer: PPO | Source: Ambulatory Visit | Attending: Radiation Oncology | Admitting: Radiation Oncology

## 2019-01-25 DIAGNOSIS — C50211 Malignant neoplasm of upper-inner quadrant of right female breast: Secondary | ICD-10-CM | POA: Diagnosis not present

## 2019-01-26 ENCOUNTER — Other Ambulatory Visit: Payer: Self-pay

## 2019-01-26 ENCOUNTER — Ambulatory Visit
Admission: RE | Admit: 2019-01-26 | Discharge: 2019-01-26 | Disposition: A | Discharge status: Home / Self Care | Payer: PPO | Source: Ambulatory Visit | Attending: Radiation Oncology | Admitting: Radiation Oncology

## 2019-01-26 DIAGNOSIS — C50211 Malignant neoplasm of upper-inner quadrant of right female breast: Secondary | ICD-10-CM | POA: Diagnosis not present

## 2019-01-27 ENCOUNTER — Other Ambulatory Visit: Payer: Self-pay

## 2019-01-27 ENCOUNTER — Ambulatory Visit
Admission: RE | Admit: 2019-01-27 | Discharge: 2019-01-27 | Disposition: A | Discharge status: Home / Self Care | Payer: PPO | Source: Ambulatory Visit | Attending: Radiation Oncology | Admitting: Radiation Oncology

## 2019-01-27 DIAGNOSIS — C50211 Malignant neoplasm of upper-inner quadrant of right female breast: Secondary | ICD-10-CM | POA: Diagnosis not present

## 2019-01-28 ENCOUNTER — Ambulatory Visit
Admission: RE | Admit: 2019-01-28 | Discharge: 2019-01-28 | Disposition: A | Discharge status: Home / Self Care | Payer: PPO | Source: Ambulatory Visit | Attending: Radiation Oncology | Admitting: Radiation Oncology

## 2019-01-28 ENCOUNTER — Other Ambulatory Visit: Payer: Self-pay

## 2019-01-28 ENCOUNTER — Ambulatory Visit: Payer: PPO | Admitting: Hematology and Oncology

## 2019-01-28 DIAGNOSIS — C50211 Malignant neoplasm of upper-inner quadrant of right female breast: Secondary | ICD-10-CM | POA: Diagnosis not present

## 2019-01-29 ENCOUNTER — Ambulatory Visit
Admission: RE | Admit: 2019-01-29 | Discharge: 2019-01-29 | Disposition: A | Discharge status: Home / Self Care | Payer: PPO | Source: Ambulatory Visit | Attending: Radiation Oncology | Admitting: Radiation Oncology

## 2019-01-29 ENCOUNTER — Other Ambulatory Visit: Payer: Self-pay

## 2019-01-29 DIAGNOSIS — C50211 Malignant neoplasm of upper-inner quadrant of right female breast: Secondary | ICD-10-CM | POA: Diagnosis not present

## 2019-02-01 ENCOUNTER — Other Ambulatory Visit: Payer: Self-pay

## 2019-02-01 ENCOUNTER — Ambulatory Visit
Admission: RE | Admit: 2019-02-01 | Discharge: 2019-02-01 | Disposition: A | Discharge status: Home / Self Care | Payer: PPO | Source: Ambulatory Visit | Attending: Radiation Oncology | Admitting: Radiation Oncology

## 2019-02-01 DIAGNOSIS — C50211 Malignant neoplasm of upper-inner quadrant of right female breast: Secondary | ICD-10-CM | POA: Diagnosis not present

## 2019-02-02 ENCOUNTER — Other Ambulatory Visit: Payer: Self-pay

## 2019-02-02 ENCOUNTER — Ambulatory Visit
Admission: RE | Admit: 2019-02-02 | Discharge: 2019-02-02 | Disposition: A | Discharge status: Home / Self Care | Payer: PPO | Source: Ambulatory Visit | Attending: Radiation Oncology | Admitting: Radiation Oncology

## 2019-02-02 DIAGNOSIS — C50211 Malignant neoplasm of upper-inner quadrant of right female breast: Secondary | ICD-10-CM | POA: Diagnosis not present

## 2019-02-03 ENCOUNTER — Other Ambulatory Visit: Payer: Self-pay

## 2019-02-03 ENCOUNTER — Ambulatory Visit
Admission: RE | Admit: 2019-02-03 | Discharge: 2019-02-03 | Disposition: A | Discharge status: Home / Self Care | Payer: PPO | Source: Ambulatory Visit | Attending: Radiation Oncology | Admitting: Radiation Oncology

## 2019-02-03 DIAGNOSIS — C50211 Malignant neoplasm of upper-inner quadrant of right female breast: Secondary | ICD-10-CM | POA: Diagnosis not present

## 2019-02-04 ENCOUNTER — Other Ambulatory Visit: Payer: Self-pay

## 2019-02-04 ENCOUNTER — Ambulatory Visit
Admission: RE | Admit: 2019-02-04 | Discharge: 2019-02-04 | Disposition: A | Discharge status: Home / Self Care | Payer: PPO | Source: Ambulatory Visit | Attending: Radiation Oncology | Admitting: Radiation Oncology

## 2019-02-04 DIAGNOSIS — C50211 Malignant neoplasm of upper-inner quadrant of right female breast: Secondary | ICD-10-CM | POA: Diagnosis not present

## 2019-02-05 ENCOUNTER — Ambulatory Visit
Admission: RE | Admit: 2019-02-05 | Discharge: 2019-02-05 | Disposition: A | Discharge status: Home / Self Care | Payer: PPO | Source: Ambulatory Visit | Attending: Radiation Oncology | Admitting: Radiation Oncology

## 2019-02-05 ENCOUNTER — Other Ambulatory Visit: Payer: Self-pay

## 2019-02-05 DIAGNOSIS — C50211 Malignant neoplasm of upper-inner quadrant of right female breast: Secondary | ICD-10-CM | POA: Diagnosis not present

## 2019-02-08 ENCOUNTER — Ambulatory Visit
Admission: RE | Admit: 2019-02-08 | Discharge: 2019-02-08 | Disposition: A | Discharge status: Home / Self Care | Payer: PPO | Source: Ambulatory Visit | Attending: Radiation Oncology | Admitting: Radiation Oncology

## 2019-02-08 ENCOUNTER — Other Ambulatory Visit: Payer: Self-pay

## 2019-02-08 DIAGNOSIS — C50211 Malignant neoplasm of upper-inner quadrant of right female breast: Secondary | ICD-10-CM | POA: Diagnosis not present

## 2019-02-08 NOTE — Progress Notes (Signed)
Patient Care Team: Lavone Orn, MD as PCP - General (Internal Medicine) Rockwell Germany, RN as Oncology Nurse Navigator Mauro Kaufmann, RN as Oncology Nurse Navigator Alphonsa Overall, MD as Consulting Physician (General Surgery) Nicholas Lose, MD as Consulting Physician (Hematology and Oncology) Eppie Gibson, MD as Attending Physician (Radiation Oncology)  DIAGNOSIS:    ICD-10-CM   1. Malignant neoplasm of upper-inner quadrant of right breast in female, estrogen receptor positive ([place])  C50.211    Z17.0     SUMMARY OF ONCOLOGIC HISTORY: Oncology History  Malignant neoplasm of upper-inner quadrant of right breast in female, estrogen receptor positive ([place])  11/13/2018 Initial Diagnosis   Routine screening mammogram detected a right breast mass, 2.6cm, at the 1 o'clock position. Biopsy showed IDC with DCIS, grade 2, HER-2 - (1+), ER+ 95%, PR+ 95%, Ki67 15%.   11/18/2018 Cancer Staging   Staging form: Breast, AJCC 8th Edition - Clinical stage from 11/18/2018: Stage IB (cT2, cN0, cM0, G2, ER+, PR+, HER2-) - Signed by Nicholas Lose, MD on 11/18/2018   11/25/2018 Genetic Testing   Negative genetic testing on the [facility] Breast Cancer STAT panel and the Common Hereditary Cancers panel. The report date is 11/25/2018.  The STAT Breast cancer panel offered by [facility] includes sequencing and rearrangement analysis for the following 9 genes:  ATM, BRCA1, BRCA2, CDH1, CHEK2, PALB2, PTEN, STK11 and TP53.  The Common Hereditary Cancers Panel offered by [facility] includes sequencing and/or deletion duplication testing of the following 48 genes: APC, ATM, AXIN2, BARD1, BMPR1A, BRCA1, BRCA2, BRIP1, CDH1, CDK4, CDKN2A (p14ARF), CDKN2A (p16INK4a), CHEK2, CTNNA1, DICER1, EPCAM (Deletion/duplication testing only), GREM1 (promoter region deletion/duplication testing only), KIT, MEN1, MLH1, MSH2, MSH3, MSH6, MUTYH, NBN, NF1, NHTL1, PALB2, PDGFRA, PMS2, POLD1, POLE, PTEN, RAD50, RAD51C, RAD51D, RNF43, SDHB, SDHC,  SDHD, SMAD4, SMARCA4. STK11, TP53, TSC1, TSC2, and VHL.  The following genes were evaluated for sequence changes only: SDHA and HOXB13 c.251G>A variant only.    12/24/2018 Surgery   Right lumpectomy Lucia Gaskins): IDC with DCIS, 3.1cm, grade 2, clear margins, 3 right axillary lymph nodes negative.  ER 95%, PR 95%, Ki-67 15%, HER-2 negative   12/24/2018 Oncotype testing   Oncotype: score 10, 3% chance of distant recurrence in 9 years with tamoxifen alone.    12/31/2018 Cancer Staging   Staging form: Breast, AJCC 8th Edition - Pathologic stage from 12/31/2018: Stage IA (pT2, pN0, cM0, G2, ER+, PR+, HER2-) - Signed by Nicholas Lose, MD on 12/31/2018   01/22/2019 -  Radiation Therapy   Adjuvant radiation     CHIEF COMPLIANT: Follow-up to discuss antiestrogen therapy  INTERVAL HISTORY: Ariel Johnson is a 68 y.o. with above-mentioned history of right breast cancer who underwent a lumpectomy and is currently on radiation treatment. She presents to the clinic today to discuss antiestrogen therapy.   REVIEW OF SYSTEMS:   Constitutional: Denies fevers, chills or abnormal weight loss Eyes: Denies blurriness of vision Ears, nose, mouth, throat, and face: Denies mucositis or sore throat Respiratory: Denies cough, dyspnea or wheezes Cardiovascular: Denies palpitation, chest discomfort Gastrointestinal: Denies nausea, heartburn or change in bowel habits Skin: Denies abnormal skin rashes Lymphatics: Denies new lymphadenopathy or easy bruising Neurological: Denies numbness, tingling or new weaknesses Behavioral/Psych: Mood is stable, no new changes  Extremities: No lower extremity edema Breast: denies any pain or lumps or nodules in either breasts All other systems were reviewed with the patient and are negative.  I have reviewed the past medical history, past surgical history, social history and  family history with the patient and they are unchanged from previous note.  ALLERGIES:  is allergic to  sulfamethoxazole and sulfur.  MEDICATIONS:  Current Outpatient Medications  Medication Sig Dispense Refill  . Calcium Carbonate-Vit D-Min (CALTRATE 600+D PLUS PO) Take by mouth daily.    Marland Kitchen DOCUSATE SODIUM PO Take 50 mg by mouth as needed.    Marland Kitchen HYDROcodone-acetaminophen (NORCO/VICODIN) 5-325 MG tablet Take 1 tablet by mouth every 6 (six) hours as needed for moderate pain. (Patient not taking: Reported on 01/12/2019) 10 tablet 0  . Magnesium 250 MG TABS Take by mouth as needed.    . Multiple Vitamin (MULTIVITAMIN) tablet Take 1 tablet by mouth daily.    Marland Kitchen omeprazole (PRILOSEC) 20 MG capsule Take 20 mg by mouth daily.    . Probiotic Product (PROBIOTIC DAILY PO) Take by mouth.    . Rivaroxaban 15 & 20 MG TBPK Follow package directions: Take one 19m tablet by mouth twice a day. On day 22, switch to one 240mtablet once a day. Take with food. 51 each 0  . Turmeric (QC TUMERIC COMPLEX PO) Take 1,400 mg by mouth daily.    . Marland KitchenNABLE TO FIND [place] Apple Cider Gummies   For Acid Reflux     No current facility-administered medications for this visit.    PHYSICAL EXAMINATION: ECOG PERFORMANCE STATUS: 1 - Symptomatic but completely ambulatory  There were no vitals filed for this visit. There were no vitals filed for this visit.  GENERAL: alert, no distress and comfortable SKIN: skin color, texture, turgor are normal, no rashes or significant lesions EYES: normal, Conjunctiva are pink and non-injected, sclera clear OROPHARYNX: no exudate, no erythema and lips, buccal mucosa, and tongue normal  NECK: supple, thyroid normal size, non-tender, without nodularity LYMPH: no palpable lymphadenopathy in the cervical, axillary or inguinal LUNGS: clear to auscultation and percussion with normal breathing effort HEART: regular rate & rhythm and no murmurs and no lower extremity edema ABDOMEN: abdomen soft, non-tender and normal bowel sounds MUSCULOSKELETAL: no cyanosis of digits and no clubbing  NEURO:  alert & oriented x 3 with fluent speech, no focal motor/sensory deficits EXTREMITIES: No lower extremity edema  LABORATORY DATA:  I have reviewed the data as listed CMP Latest Ref Rng & Units 11/18/2018  Glucose 70 - 99 mg/dL 60(L)  BUN 8 - 23 mg/dL 15  Creatinine 0.44 - 1.00 mg/dL 0.84  Sodium 135 - 145 mmol/L 142  Potassium 3.5 - 5.1 mmol/L 3.6  Chloride 98 - 111 mmol/L 105  CO2 22 - 32 mmol/L 27  Calcium 8.9 - 10.3 mg/dL 9.3  Total Protein 6.5 - 8.1 g/dL 7.0  Total Bilirubin 0.3 - 1.2 mg/dL 0.7  Alkaline Phos 38 - 126 U/L 87  AST 15 - 41 U/L 21  ALT 0 - 44 U/L 18    Lab Results  Component Value Date   WBC 5.4 11/18/2018   HGB 12.9 11/18/2018   HCT 39.7 11/18/2018   MCV 92.1 11/18/2018   PLT 165 11/18/2018   NEUTROABS 3.0 11/18/2018    ASSESSMENT & PLAN:  Malignant neoplasm of upper-inner quadrant of right breast in female, estrogen receptor positive ([place]:Routine screening mammogram detected a right breast mass, 2.6cm, at the 1 o'clock position. Biopsy showed IDC with DCIS, grade 2, HER-2 - (1+), ER+ 95%, PR+ 95%, Ki67 15%. T2N0 stage Ib Prior history of left mastectomy 1998 Treatment plan: 1. 12/24/2018:Right lumpectomy (NLucia Gaskins IDC with DCIS, 3.1cm, grade 2, clear margins, 3 right axillary  lymph nodes negative.  T2 N0 stage Ib 2. Oncotype: score 10, 3% chance of distant recurrence in 9 years with tamoxifen alone.  3. Adjuvant radiation therapy12/05/2018 4. Adjuvant antiestrogen therapywith anastrozole 1 mg daily x5 to 7 years --------------------------------------------------------------------------------------------------------------------------- Treatment plan: Adjuvant antiestrogen therapy with anastrozole to begin January 15th 2021 Anastrozole counseling:We discussed the risks and benefits of anti-estrogen therapy with aromatase inhibitors. These include but not limited to insomnia, hot flashes, mood changes, vaginal dryness, bone density loss, and  weight gain. We strongly believe that the benefits far outweigh the risks. Patient understands these risks and consented to starting treatment. Planned treatment duration is 7 years.  DVT left leg: She took Xarelto for 3 weeks and she stopped it because she ran out of the prescription.  She did not realize that she needs to skip taking it.  I renewed her prescription for Xarelto today.  Return to clinic in 3 months for survivorship care plan visit  No orders of the defined types were placed in this encounter.  The patient has a good understanding of the overall plan. she agrees with it. she will call with any problems that may develop before the next visit here.  Nicholas Lose, MD 02/09/2019  Julious Oka Dorshimer, am acting as scribe for Dr. Nicholas Lose.  I have reviewed the above document for accuracy and completeness, and I agree with the above.

## 2019-02-09 ENCOUNTER — Other Ambulatory Visit: Payer: Self-pay

## 2019-02-09 ENCOUNTER — Inpatient Hospital Stay: Payer: PPO | Attending: Hematology and Oncology | Admitting: Hematology and Oncology

## 2019-02-09 ENCOUNTER — Ambulatory Visit
Admission: RE | Admit: 2019-02-09 | Discharge: 2019-02-09 | Disposition: A | Discharge status: Home / Self Care | Payer: PPO | Source: Ambulatory Visit | Attending: Radiation Oncology | Admitting: Radiation Oncology

## 2019-02-09 DIAGNOSIS — C50211 Malignant neoplasm of upper-inner quadrant of right female breast: Secondary | ICD-10-CM | POA: Diagnosis not present

## 2019-02-09 DIAGNOSIS — Z170 Estrogen receptor positive status [ER+]: Secondary | ICD-10-CM | POA: Insufficient documentation

## 2019-02-09 DIAGNOSIS — Z9012 Acquired absence of left breast and nipple: Secondary | ICD-10-CM | POA: Insufficient documentation

## 2019-02-09 DIAGNOSIS — I82402 Acute embolism and thrombosis of unspecified deep veins of left lower extremity: Secondary | ICD-10-CM | POA: Insufficient documentation

## 2019-02-09 DIAGNOSIS — Z79811 Long term (current) use of aromatase inhibitors: Secondary | ICD-10-CM | POA: Insufficient documentation

## 2019-02-09 MED ORDER — RIVAROXABAN 20 MG PO TABS: 20.0000 mg | ORAL_TABLET | Freq: Every day | ORAL | 4 refills | Status: DC

## 2019-02-09 MED ORDER — ANASTROZOLE 1 MG PO TABS: 1.0000 mg | ORAL_TABLET | Freq: Every day | ORAL | 3 refills | Status: DC

## 2019-02-09 NOTE — Assessment & Plan Note (Signed)
11/13/2018:Routine screening mammogram detected a right breast mass, 2.6cm, at the 1 o'clock position. Biopsy showed IDC with DCIS, grade 2, HER-2 - (1+), ER+ 95%, PR+ 95%, Ki67 15%. T2N0 stage Ib Prior history of left mastectomy 1998 Treatment plan: 1. 12/24/2018:Right lumpectomy Ariel Johnson): IDC with DCIS, 3.1cm, grade 2, clear margins, 3 right axillary lymph nodes negative.  T2 N0 stage Ib 2. Oncotype: score 10, 3% chance of distant recurrence in 9 years with tamoxifen alone.  3. Adjuvant radiation therapy12/05/2018 4. Adjuvant antiestrogen therapywith anastrozole 1 mg daily x5 to 7 years --------------------------------------------------------------------------------------------------------------------------- Treatment plan: Adjuvant antiestrogen therapy with anastrozole to begin January 2021 Anastrozole counseling:We discussed the risks and benefits of anti-estrogen therapy with aromatase inhibitors. These include but not limited to insomnia, hot flashes, mood changes, vaginal dryness, bone density loss, and weight gain. We strongly believe that the benefits far outweigh the risks. Patient understands these risks and consented to starting treatment. Planned treatment duration is 7 years.  Return to clinic in 3 months for survivorship care plan visit

## 2019-02-10 ENCOUNTER — Telehealth: Payer: Self-pay | Admitting: Adult Health

## 2019-02-10 ENCOUNTER — Ambulatory Visit
Admission: RE | Admit: 2019-02-10 | Discharge: 2019-02-10 | Disposition: A | Discharge status: Home / Self Care | Payer: PPO | Source: Ambulatory Visit | Attending: Radiation Oncology | Admitting: Radiation Oncology

## 2019-02-10 ENCOUNTER — Other Ambulatory Visit: Payer: Self-pay

## 2019-02-10 DIAGNOSIS — C50211 Malignant neoplasm of upper-inner quadrant of right female breast: Secondary | ICD-10-CM | POA: Diagnosis not present

## 2019-02-10 NOTE — Telephone Encounter (Signed)
I left a message regarding schedule  

## 2019-02-11 ENCOUNTER — Ambulatory Visit
Admission: RE | Admit: 2019-02-11 | Discharge: 2019-02-11 | Disposition: A | Discharge status: Home / Self Care | Payer: PPO | Source: Ambulatory Visit | Attending: Radiation Oncology | Admitting: Radiation Oncology

## 2019-02-11 ENCOUNTER — Other Ambulatory Visit: Payer: Self-pay

## 2019-02-11 DIAGNOSIS — C50211 Malignant neoplasm of upper-inner quadrant of right female breast: Secondary | ICD-10-CM

## 2019-02-11 MED ORDER — SONAFINE EX EMUL
1.0000 "application " | Freq: Once | CUTANEOUS | Status: AC
  Administered 2019-02-11: 1 via TOPICAL

## 2019-02-15 ENCOUNTER — Other Ambulatory Visit: Payer: Self-pay

## 2019-02-15 ENCOUNTER — Ambulatory Visit
Admission: RE | Admit: 2019-02-15 | Discharge: 2019-02-15 | Disposition: A | Discharge status: Home / Self Care | Payer: PPO | Source: Ambulatory Visit | Attending: Radiation Oncology | Admitting: Radiation Oncology

## 2019-02-15 DIAGNOSIS — C50211 Malignant neoplasm of upper-inner quadrant of right female breast: Secondary | ICD-10-CM | POA: Diagnosis not present

## 2019-02-16 ENCOUNTER — Ambulatory Visit
Admission: RE | Admit: 2019-02-16 | Discharge: 2019-02-16 | Disposition: A | Discharge status: Home / Self Care | Payer: PPO | Source: Ambulatory Visit | Attending: Radiation Oncology | Admitting: Radiation Oncology

## 2019-02-16 ENCOUNTER — Other Ambulatory Visit: Payer: Self-pay

## 2019-02-16 DIAGNOSIS — C50211 Malignant neoplasm of upper-inner quadrant of right female breast: Secondary | ICD-10-CM | POA: Diagnosis not present

## 2019-02-17 ENCOUNTER — Ambulatory Visit
Admission: RE | Admit: 2019-02-17 | Discharge: 2019-02-17 | Disposition: A | Discharge status: Home / Self Care | Payer: PPO | Source: Ambulatory Visit | Attending: Radiation Oncology | Admitting: Radiation Oncology

## 2019-02-17 ENCOUNTER — Other Ambulatory Visit: Payer: Self-pay

## 2019-02-17 DIAGNOSIS — C50211 Malignant neoplasm of upper-inner quadrant of right female breast: Secondary | ICD-10-CM | POA: Diagnosis not present

## 2019-02-18 ENCOUNTER — Ambulatory Visit
Admission: RE | Admit: 2019-02-18 | Discharge: 2019-02-18 | Disposition: A | Discharge status: Home / Self Care | Payer: PPO | Source: Ambulatory Visit | Attending: Radiation Oncology | Admitting: Radiation Oncology

## 2019-02-18 ENCOUNTER — Encounter: Payer: Self-pay | Admitting: Radiation Oncology

## 2019-02-18 ENCOUNTER — Other Ambulatory Visit: Payer: Self-pay

## 2019-02-18 ENCOUNTER — Encounter: Payer: Self-pay | Admitting: *Deleted

## 2019-02-18 DIAGNOSIS — C50211 Malignant neoplasm of upper-inner quadrant of right female breast: Secondary | ICD-10-CM | POA: Diagnosis not present

## 2019-03-04 ENCOUNTER — Other Ambulatory Visit: Payer: Self-pay | Admitting: *Deleted

## 2019-03-04 ENCOUNTER — Encounter: Payer: Self-pay | Admitting: Physical Therapy

## 2019-03-04 ENCOUNTER — Other Ambulatory Visit: Payer: Self-pay

## 2019-03-04 ENCOUNTER — Ambulatory Visit: Payer: PPO | Attending: Surgery | Admitting: Physical Therapy

## 2019-03-04 DIAGNOSIS — Z483 Aftercare following surgery for neoplasm: Secondary | ICD-10-CM | POA: Diagnosis not present

## 2019-03-04 DIAGNOSIS — R293 Abnormal posture: Secondary | ICD-10-CM | POA: Diagnosis not present

## 2019-03-04 DIAGNOSIS — C50211 Malignant neoplasm of upper-inner quadrant of right female breast: Secondary | ICD-10-CM

## 2019-03-04 DIAGNOSIS — Z170 Estrogen receptor positive status [ER+]: Secondary | ICD-10-CM | POA: Diagnosis not present

## 2019-03-04 NOTE — Therapy (Signed)
[place], [place], [address] Phone: [phone]   Fax:  [phone]  Physical Therapy Treatment  Patient Details  Name: Ariel Johnson MRN: [id number] Date of Birth: Oct 12, 1950 Referring Provider (PT): Dr. Alphonsa Overall   Encounter Date: 03/04/2019  PT End of Session - 03/04/19 1045    Visit Number  3    Number of Visits  3    PT Start Time  1000    PT Stop Time  1045    PT Time Calculation (min)  45 min    Activity Tolerance  Patient tolerated treatment well    Behavior During Therapy  [facility] for tasks assessed/performed       Past Medical History:  Diagnosis Date  . Ductal carcinoma in situ (DCIS) of left breast   . GERD (gastroesophageal reflux disease)   . Hepatitis   . History of cancer of left breast     Past Surgical History:  Procedure Laterality Date  . BREAST BIOPSY Right   . BREAST BIOPSY Right   . BREAST LUMPECTOMY WITH RADIOACTIVE SEED AND SENTINEL LYMPH NODE BIOPSY Right 12/24/2018   Procedure: RIGHT BREAST LUMPECTOMY WITH RADIOACTIVE SEED AND RIGHT AXILLARY SENTINEL LYMPH NODE BIOPSY;  Surgeon: Alphonsa Overall, MD;  Location: [facility];  Service: General;  Laterality: Right;  . CHOLECYSTECTOMY    . MASTECTOMY Left 1998  . PARTIAL COLECTOMY    . TUBAL LIGATION      There were no vitals filed for this visit.  Subjective Assessment - 03/04/19 1018    Subjective  Patient reports she is doing great. She completed radiation 02/18/2019 and is starting Arimidex tomorrow 03/05/2019. My scar seems to be doing ok. Since she was last seen, she was diagnosed with a left leg DVT and is currently on Xarelto.    Pertinent History  Patient was diagnosed on 10/29/2018 with right grade II invasive ductal carcinoma breast cancer. Patient underwent right lumpectomy and sentinel node biopsy (0/3 nodes positive) on 12/24/2018. It is ER/PR positive and HER2 negative with a Ki67 of 15%.  She has a history of left breast cancer in 1998 with a mastectomy but it is unknown how many axillary nodes were removed. She reports never having left arm lymphedema.    Patient Stated Goals  Check my scar    Currently in Pain?  No/denies         [facility] PT Assessment - 03/04/19 0001      Assessment   Medical Diagnosis  s/p right lumpectomy and SLNB    Referring Provider (PT)  Dr. Alphonsa Overall    Onset Date/Surgical Date  12/24/18    Hand Dominance  Right      Precautions   Precautions  Other (comment)    Precaution Comments  recent surgery; bil UE lymphedema risk      Restrictions   Weight Bearing Restrictions  No      Balance Screen   Has the patient fallen in the past 6 months  No    Has the patient had a decrease in activity level because of a fear of falling?   No    Is the patient reluctant to leave their home because of a fear of falling?   No      Home Environment   Living Environment  Private residence    Living Arrangements  Spouse/significant other    Available Help at Discharge  Friend(s)      Prior  Function   Level of Independence  Independent    Vocation  Retired    Leisure  She is walking 2-3x/week for 1 hour      Cognition   Overall Cognitive Status  Within Functional Limits for tasks assessed      Observation/Other Assessments   Observations  Incisions are completely healed with good tissue mobility and no c/o tenderness to palpation.      Posture/Postural Control   Posture/Postural Control  Postural limitations    Postural Limitations  Rounded Shoulders;Forward head      ROM / Strength   AROM / PROM / Strength  AROM      AROM   AROM Assessment Site  Shoulder    Right/Left Shoulder  Right    Right Shoulder Extension  52 Degrees    Right Shoulder Flexion  148 Degrees    Right Shoulder ABduction  160 Degrees    Right Shoulder Internal Rotation  64 Degrees    Right Shoulder External Rotation  84 Degrees        LYMPHEDEMA/ONCOLOGY  QUESTIONNAIRE - 03/04/19 1043      Type   Cancer Type  Right breast cancer      Surgeries   Lumpectomy Date  12/24/18    Sentinel Lymph Node Biopsy Date  12/24/18    Number Lymph Nodes Removed  3      Treatment   Active Chemotherapy Treatment  No    Past Chemotherapy Treatment  No    Active Radiation Treatment  No    Past Radiation Treatment  Yes    Date  02/18/19    Body Site  right breast    Current Hormone Treatment  No    Past Hormone Therapy  No      What other symptoms do you have   Are you Having Heaviness or Tightness  No    Are you having Pain  No    Are you having pitting edema  No    Is it Hard or Difficult finding clothes that fit  No    Do you have infections  No    Is there Decreased scar mobility  No    St[place]mer Sign  No        [name] - 03/04/19 0001    Open a tight or new jar  No difficulty    Do heavy household chores (wash walls, wash floors)  No difficulty    Carry a shopping bag or briefcase  No difficulty    Wash your back  No difficulty    Use a knife to cut food  No difficulty    Recreational activities in which you take some force or impact through your arm, shoulder, or hand (golf, hammering, tennis)  No difficulty    During the past week, to what extent has your arm, shoulder or hand problem interfered with your normal social activities with family, friends, neighbors, or groups?  Not at all    During the past week, to what extent has your arm, shoulder or hand problem limited your work or other regular daily activities  Not at all    Arm, shoulder, or hand pain.  None    Tingling (pins and needles) in your arm, shoulder, or hand  None    Difficulty Sleeping  No difficulty    DASH Score  0 %  PT Education - 03/04/19 1044    Education Details  Educated pt on [facility] class and pelvic floor rehab for education on reducing some side effects that may occur with anti-estrogen therapy.    Person(s) Educated  Patient     Methods  Explanation;Handout    Comprehension  Verbalized understanding          PT Long Term Goals - 01/07/19 1253      PT LONG TERM GOAL #1   Title  Patient will demonstrate she has regained shoulder ROM and function post operatively compared to baselines.    Time  8    Period  Weeks    Status  Achieved      PT LONG TERM GOAL #2   Title  Patient will report less tissue tightness and good mobility of scar.    Time  8    Period  Weeks    Status  New    Target Date  03/04/19            Plan - 03/04/19 1045    Clinical Impression Statement  Patient is doing very well s/p radiation for right breast cancer. She returned to the clinic today for a f/u visit to have her scar assessed and shoulder ROM reassessed. She has regained nearly full shoudler ROM and has no signs of lymphedema. Her incision sites are well healed and have good mobility.She exppressed interest in seeing our pelvic floor PT for education on addressing vaginal dryness which she reports having and may worsen with Arimidex. She was also educated on attending [facility]. Otherwise she has no PT needs at this time.    PT Treatment/Interventions  ADLs/Self Care Home Management;Therapeutic exercise;Patient/family education;Scar mobilization    PT Next Visit Plan  D/C    Recommended Other Services  Will see pelvic floor therapist on 03/22/2019.    Consulted and Agree with Plan of Care  Patient       Patient will benefit from skilled therapeutic intervention in order to improve the following deficits and impairments:  Postural dysfunction, Decreased range of motion, Pain, Impaired UE functional use, Decreased knowledge of precautions, Increased fascial restricitons, Decreased scar mobility  Visit Diagnosis: Malignant neoplasm of upper-inner quadrant of right breast in female, estrogen receptor positive ([facility])  Abnormal posture  Aftercare following surgery for neoplasm     Problem List Patient Active Problem List    Diagnosis Date Noted  . Genetic testing 11/25/2018  . History of cancer of left breast   . Malignant neoplasm of upper-inner quadrant of right breast in female, estrogen receptor positive ([place]) 11/13/2018    PHYSICAL THERAPY DISCHARGE SUMMARY  Visits from Start of Care: 3  Current functional level related to goals / functional outcomes: Goals met and she is doing well s/p radiation.   Remaining deficits: None   Education / Equipment: FYNN; walking program Plan: Patient agrees to discharge.  Patient goals were met. Patient is being discharged due to meeting the stated rehab goals.  ?????         Annia Friendly, [place] 03/04/19 10:52 AM  [place], [place], [address] Phone: [phone]   Fax:  [phone]  Name: LATIA MATAYA MRN: [id number] Date of Birth: [birth year]

## 2019-03-05 ENCOUNTER — Ambulatory Visit: Payer: Medicare Other | Attending: Internal Medicine

## 2019-03-05 DIAGNOSIS — Z23 Encounter for immunization: Secondary | ICD-10-CM | POA: Insufficient documentation

## 2019-03-05 NOTE — Progress Notes (Signed)
   Covid-19 Vaccination Clinic  Name:  Ariel Johnson    MRN: [id number] DOB: 1950/10/02  03/05/2019  Ariel Johnson was observed post Covid-19 immunization for 15 minutes without incidence. She was provided with Vaccine Information Sheet and instruction to access the V-Safe system.   Ariel Johnson was instructed to call 911 with any severe reactions post vaccine: Marland Kitchen Difficulty breathing  . Swelling of your face and throat  . A fast heartbeat  . A bad rash all over your body  . Dizziness and weakness    Immunizations Administered    Name Date Dose VIS Date Route   Pfizer COVID-19 Vaccine 03/05/2019  8:57 AM 0.3 mL 01/29/2019 Intramuscular   Manufacturer: [place]   Lot: [id number]   [place]: [id number]

## 2019-03-16 NOTE — Progress Notes (Signed)
  Patient Name: Ariel Johnson MRN: [id number] DOB: 12-05-1950 Referring Physician: Nicholas Lose (Profile Not Attached) Date of Service: 02/18/2019 [facility], [place]                                                        End Of Treatment Note  Diagnoses: C50.211-Malignant neoplasm of upper-inner quadrant of right female breast  Cancer Staging: Cancer Staging Malignant neoplasm of upper-inner quadrant of right breast in female, estrogen receptor positive ([place]) Staging form: Breast, AJCC 8th Edition - Clinical stage from 11/18/2018: Stage IB (cT2, cN0, cM0, G2, ER+, PR+, HER2-) - Signed by Nicholas Lose, MD on 11/18/2018 - Pathologic stage from 12/31/2018: Stage IA (pT2, pN0, cM0, G2, ER+, PR+, HER2-) - Signed by Nicholas Lose, MD on 12/31/2018  Intent: Curative  Radiation Treatment Dates: 02/18/1898 through 02/18/2019  Site Technique Total Dose (Gy) Dose per Fx (Gy) Completed Fx Beam Energies  Breast, Right: Breast_Rt 3D 40.05/40.05 2.67 15/15 6X, 10X  Breast, Right: Breast_Rt_Bst specialPort 10/10 2 5/5 9E   Narrative: The patient tolerated radiation therapy relatively well.   Plan: The patient will follow-up with radiation oncology in 1 month, or as needed. -----------------------------------  Eppie Gibson, MD

## 2019-03-18 ENCOUNTER — Encounter: Payer: Self-pay | Admitting: Radiation Oncology

## 2019-03-18 NOTE — Progress Notes (Signed)
I called the patient today about her upcoming follow-up appointment in radiation oncology.   Given the state of the COVID-19 pandemic, concerning case numbers in our community, and guidance from [facility], I offered a phone assessment with the patient to determine if coming to the clinic was necessary. She accepted.  I let the patient know that I had spoken with Dr. Isidore Moos, and she wanted them to know the importance of washing their hands for at least 20 seconds at a time, especially after going out in public, and before they eat.  Limit going out in public whenever possible. Do not touch your face, unless your hands are clean, such as when bathing. Get plenty of rest, eat well, and stay hydrated.   The patient denies any symptomatic concerns.  Specifically, they report good healing of their skin in the radiation fields.  Skin is intact.    I recommended that she continue skin care by applying oil or lotion with vitamin E to the skin in the radiation fields, BID, for 2 more months.  Continue follow-up with medical oncology - follow-up is scheduled on 05/12/19 Wilber Bihari NP of survivorship.  I explained that yearly mammograms are important for patients with intact breast tissue, and physical exams are important after mastectomy for patients that cannot undergo mammography.  I encouraged her to call if she had further questions or concerns about her healing. Otherwise, she will follow-up PRN in radiation oncology. Patient is pleased with this plan, and we will cancel her upcoming follow-up to reduce the risk of COVID-19 transmission.

## 2019-03-19 ENCOUNTER — Ambulatory Visit
Admission: RE | Admit: 2019-03-19 | Discharge: 2019-03-19 | Disposition: A | Discharge status: Home / Self Care | Payer: PPO | Source: Ambulatory Visit | Attending: Radiation Oncology | Admitting: Radiation Oncology

## 2019-03-19 HISTORY — DX: Personal history of irradiation: Z92.3

## 2019-03-22 ENCOUNTER — Other Ambulatory Visit: Payer: Self-pay

## 2019-03-22 ENCOUNTER — Ambulatory Visit: Payer: PPO | Attending: Hematology and Oncology | Admitting: Physical Therapy

## 2019-03-22 ENCOUNTER — Encounter: Payer: Self-pay | Admitting: Physical Therapy

## 2019-03-22 DIAGNOSIS — Z483 Aftercare following surgery for neoplasm: Secondary | ICD-10-CM | POA: Diagnosis not present

## 2019-03-22 DIAGNOSIS — N393 Stress incontinence (female) (male): Secondary | ICD-10-CM | POA: Insufficient documentation

## 2019-03-22 DIAGNOSIS — R278 Other lack of coordination: Secondary | ICD-10-CM | POA: Insufficient documentation

## 2019-03-22 DIAGNOSIS — M6281 Muscle weakness (generalized): Secondary | ICD-10-CM | POA: Diagnosis not present

## 2019-03-22 NOTE — Patient Instructions (Addendum)
Moisturizers . They are used in the vagina to hydrate the mucous membrane that make up the vaginal canal. . Designed to keep a more normal acid balance (ph) . Once placed in the vagina, it will last between two to three days.  . Use 2-3 times per week at bedtime  . Ingredients to avoid is glycerin and fragrance, can increase chance of infection . Should not be used just before sex due to causing irritation . Most are gels administered either in a tampon-shaped applicator or as a vaginal suppository. They are non-hormonal.   Types of Moisturizers  . Vitamin E vaginal suppositories- Whole foods, Amazon . Moist Again . Coconut oil- can break down condoms . Julva- (Do no use if on Tamoxifen) amazon . Yes moisturizer- amazon . NeuEve Silk , NeuEve Silver for menopausal or over 65 (if have severe vaginal atrophy or cancer treatments use NeuEve Silk for  1 month than move to The Pepsi)- [employer], MapleFlower.dk . Olive and Bee intimate cream- www.oliveandbee.com.au . Mae vaginal [place] . Aloe .    Creams to use externally on the Vulva area  Albertson's (good for for cancer patients that had radiation to the area)- [place] or Danaher Corporation.FlyingBasics.com.br  V-magic cream - amazon  Julva-amazon  Vital "V Wild Yam salve ( help moisturize and help with thinning vulvar area, does have [place] by Irwin Brakeman labial moisturizer ([place],   Coconut or olive oil  aloe   Things to avoid in the vaginal area . Do not use things to irritate the vulvar area . No lotions just specialized creams for the vulva area- Neogyn, V-magic, No soaps; can use Aveeno or Calendula cleanser if needed. Must be gentle . No deodorants . No douches . Good to sleep without underwear to let the vaginal area to air out . No scrubbing: spread the lips to let warm water rinse over labias and pat dry  Lubrication . Used for  intercourse to reduce friction . Avoid ones that have glycerin, warming gels, tingling gels, icing or cooling gel, scented . Avoid parabens due to a preservative similar to female sex hormone . May need to be reapplied once or several times during sexual activity . Can be applied to both partners genitals prior to vaginal penetration to minimize friction or irritation . Prevent irritation and mucosal tears that cause post coital pain and increased the risk of vaginal and urinary tract infections . Oil-based lubricants cannot be used with condoms due to breaking them down.  Least likely to irritate vaginal tissue.  . Plant based-lubes are safe . Silicone-based lubrication are thicker and last long and used for post-menopausal women  Vaginal Lubricators Here is a list of some suggested lubricators you can use for intercourse. Use the most hypoallergenic product.  You can place on you or your partner.   Slippery Stuff ( water based)  Sylk or Sliquid Natural H2O ( good  if frequent UTI's)- walmart, amazon  Sliquid organics silk-(aloe and silicone based )  Bank [employer] (www.blossom-organics.com)- (aloe based )  Coconut oil, olive oil -not good with condoms   PJur Woman Nude- (water based) amazon  Uberlube- ( silicon) Annetta has an organic one  Yes lubricant- (water based and has plant oil based similar to silicone) [employer] Platinum-Silicone, Target, Walgreens  Olive and Bee intimate cream-  www.oliveandbee.com.au  Pink - [employer]Warner  Erosense Sync- walmart, Morgan Stanley to avoid in lubricants are glycerin, warming gels, tingling gels, icing or cooling  gels, and scented gels.  Also avoid Vaseline. KY jelly, Replens, and Astroglide kills good bacteria(lactobacilli)  Things to avoid in the vaginal area . Do not use things to irritate the vulvar area . No lotions- see below . No soaps; can use Aveeno or Calendula cleanser if needed. Must be  gentle . No deodorants . No douches . Good to sleep without underwear to let the vaginal area to air out . No scrubbing: spread the lips to let warm water rinse over labias and pat dry  Creams that can be used on the Vulva Area  V Bank [employer] [place] [employer], walmart  Vital V Wild Yam Salve  Julva- [employer] Botanical Pro-Meno Wild Yam Cream  Coconut oil, olive oil  Cleo by [employer] labial moisturizer -Amazon,   [place] Releveum ( lidocaine) or Desert Conseco

## 2019-03-22 NOTE — Therapy (Signed)
[facility] [address] [place], [place], [address] Phone: [phone]   Fax:  [phone]  Physical Therapy Evaluation  Patient Details  Name: Ariel Johnson MRN: [id number] Date of Birth: 1950/06/05 Referring Provider (PT): Dr. Nicholas Lose   Encounter Date: 03/22/2019  PT End of Session - 03/22/19 1500    Visit Number  1    Date for PT Re-Evaluation  05/17/19    Authorization Type  [employer]    PT Start Time  1400    PT Stop Time  1445    PT Time Calculation (min)  45 min    Activity Tolerance  Patient tolerated treatment well;No increased pain    Behavior During Therapy  WFL for tasks assessed/performed       Past Medical History:  Diagnosis Date  . Ductal carcinoma in situ (DCIS) of left breast   . GERD (gastroesophageal reflux disease)   . Hepatitis   . History of cancer of left breast   . History of radiation therapy 01/21/19- 02/18/19   Right Breast 15 fx of 2.67 Gy each to total 40.05 Gy. Right Breast boost 5 fx of 2 Gy each to total 10 Gy    Past Surgical History:  Procedure Laterality Date  . BREAST BIOPSY Right   . BREAST BIOPSY Right   . BREAST LUMPECTOMY WITH RADIOACTIVE SEED AND SENTINEL LYMPH NODE BIOPSY Right 12/24/2018   Procedure: RIGHT BREAST LUMPECTOMY WITH RADIOACTIVE SEED AND RIGHT AXILLARY SENTINEL LYMPH NODE BIOPSY;  Surgeon: Alphonsa Overall, MD;  Location: [place];  Service: General;  Laterality: Right;  . CHOLECYSTECTOMY    . MASTECTOMY Left 1998  . PARTIAL COLECTOMY    . TUBAL LIGATION      There were no vitals filed for this visit.   Subjective Assessment - 03/22/19 1405    Subjective  I do not have side effects yet from the medicaiton. Urinary leakage for years. Patient wears a pad daily. Sometimes I am unable to control my urine.    Pertinent History  Patient was diagnosed on 10/29/2018 with right grade II invasive ductal carcinoma breast  cancer. Patient underwent right lumpectomy and sentinel node biopsy (0/3 nodes positive) on 12/24/2018. It is ER/PR positive and HER2 negative with a Ki67 of 15%. She has a history of left breast cancer in 1998 with a mastectomy but it is unknown how many axillary nodes were removed. She reports never having left arm lymphedema.    Patient Stated Goals  reduce vaginal dryness    Currently in Pain?  No/denies         [facility] PT Assessment - 03/22/19 0001      Assessment   Medical Diagnosis  C50.211,Z17.0 Malignant neoplasm of upper-inner quadrant of right breast in female, estrogen redeptor positive    Referring Provider (PT)  Dr. Nicholas Lose    Onset Date/Surgical Date  12/24/18    Prior Therapy  not for pelvic floor      Precautions   Precautions  Other (comment)    Precaution Comments  recent surgery; bil UE lymphedema risk      Restrictions   Weight Bearing Restrictions  No      Balance Screen   Has the patient fallen in the past 6 months  No    Has the patient had a decrease in activity level because of a fear of falling?   No    Is the patient reluctant to leave their  home because of a fear of falling?   No      Home Film/video editor residence    Living Arrangements  Spouse/significant other    Available Help at Discharge  Friend(s)      Prior Function   Level of [place]  Retired    Leisure  She is walking 2-3x/week for 1 hour      Cognition   Overall Cognitive Status  Within Functional Limits for tasks assessed      Posture/Postural Control   Posture/Postural Control  Postural limitations    Postural Limitations  Rounded Shoulders;Forward head      ROM / Strength   AROM / PROM / Strength  AROM;PROM;Strength      PROM   Right Hip Flexion  112    Right Hip External Rotation   30    Left Hip External Rotation   45      Strength   Right Hip External Rotation   4/5    Right Hip ABduction  3+/5    Left Hip  External Rotation  4/5    Left Hip ABduction  3+/5                Objective measurements completed on examination: See above findings.    Pelvic Floor Special Questions - 03/22/19 0001    Prior Pregnancies  Yes    Episiotomy Performed  Yes    Currently Sexually Active  Yes   needs lubrication   Urinary Leakage  Yes    Pad use  1-2  pad per day    Activities that cause leaking  With strong urge;Coughing;Sneezing;Laughing    Urinary urgency  Yes   fecal urgency   Fecal incontinence  Yes    Skin Integrity  Intact   dryness   Prolapse  None    Pelvic Floor Internal Exam  Patient confirms identification and approves PT to assess pelvic floor and treatment    Exam Type  Vaginal    Sensation  when patient couged she would bear down    Palpation  tightness in the perineal body, and left pelvic floor muscles    Strength  Flicker       [facility] Adult PT Treatment/Exercise - 03/22/19 0001      Self-Care   Self-Care  Other Self-Care Comments    Other Self-Care Comments   education on vaginal lubrication, moisturizers, how to massage the vulva, labia, and perineal body to improve dryness and tissue restrictions, how to take care of the vulvar area with cleaning and using no soap             PT Education - 03/22/19 1459    Education Details  education on vaginal moisturizers, lubricants, perineal care, and vulvar massage    Person(s) Educated  Patient    Methods  Explanation;Demonstration;Verbal cues;Handout    Comprehension  Verbalized understanding;Returned demonstration       PT Short Term Goals - 03/22/19 1508      PT SHORT TERM GOAL #1   Title  independent with initial HEP    Time  4    Period  Weeks    Status  New    Target Date  04/19/19      PT SHORT TERM GOAL #2   Title  understand vaginal health and how to improve quality of tissue    Time  4    Period  Weeks  Status  New    Target Date  04/19/19      PT SHORT TERM GOAL #3   Title  able to fully  contract the pelvic floor with increased strength >/= 2/5    Time  4    Period  Weeks    Status  New    Target Date  04/19/19        PT Long Term Goals - 03/22/19 1510      PT LONG TERM GOAL #1   Title  independent with advanced HEP    Time  8    Period  Weeks    Status  New    Target Date  05/17/19      PT LONG TERM GOAL #2   Title  urinary leakage reduce >/= 75% due to improved strength >/= 3/5    Time  8    Period  Weeks    Status  New    Target Date  05/17/19      PT LONG TERM GOAL #3   Title  understand correct toileting to reduce strain on the pelvic floor    Time  8    Period  Weeks    Status  New    Target Date  05/17/19      PT LONG TERM GOAL #4   Title  able to walk to the commode without rushing due to urge to void reduced >/= 80%    Time  8    Period  Weeks    Status  New    Target Date  05/17/19             Plan - 03/22/19 1501    Clinical Impression Statement  Patient is a 69 year old female s/p estrogen positive breast cancer and is now taking Arimidex. Patient reports urinary leakage and vaginal dryness. Pelvic floor strength is 1/5 and she will bear down when she sneezes. Patient has tightness in the perineal body and left pelvic floor. Patient reports she will leak urine with sneezing, coughing, laughing, and with strong urge. Patient will have some fecal leakage when she has a strong urge and just makes it to the commode. Patient has weakness in hips. Patient lumbar ROM is limited by 25% with extension. Patient will benefit from skilled therapy to improve pelvic floor strength, improve coordination and education on vaginal health.    Personal Factors and Comorbidities  Age;Comorbidity 2    Comorbidities  right estrogen positive Breast cancer 12/24/18 with radiation 12/3-12/31/20; left masectomy 1998    Examination-Activity Limitations  Continence;Toileting    Examination-Participation Restrictions  Interpersonal Relationship    Stability/Clinical  Decision Making  Evolving/Moderate complexity    Clinical Decision Making  Low    Rehab Potential  Excellent    PT Frequency  1x / week    PT Duration  8 weeks    PT Treatment/Interventions  ADLs/Self Care Home Management;Biofeedback;Neuromuscular re-education;Therapeutic exercise;Therapeutic activities;Patient/family education;Manual techniques;Dry needling    PT Next Visit Plan  pelvic floor contraction in supine, review vaginal health, hip stretches, bladder irritants, diaphragmatic breathing, toileting    Consulted and Agree with Plan of Care  Patient       Patient will benefit from skilled therapeutic intervention in order to improve the following deficits and impairments:  Decreased coordination, Increased fascial restricitons, Decreased endurance, Decreased strength  Visit Diagnosis: Muscle weakness (generalized) - Plan: PT plan of care cert/re-cert  Other lack of coordination - Plan: PT plan of care  cert/re-cert  Stress incontinence (female) (female) - Plan: PT plan of care cert/re-cert  Aftercare following surgery for neoplasm - Plan: PT plan of care cert/re-cert     Problem List Patient Active Problem List   Diagnosis Date Noted  . Genetic testing 11/25/2018  . History of cancer of left breast   . Malignant neoplasm of upper-inner quadrant of right breast in female, estrogen receptor positive ([place]) 11/13/2018    Earlie Counts, PT 03/22/19 3:15 PM   [facility] [address], [place], [place], [address] Phone: [phone]   Fax:  [phone]  Name: Ariel Johnson MRN: [id number] Date of Birth: Aug 31, 1950

## 2019-03-23 ENCOUNTER — Ambulatory Visit: Payer: Medicare Other | Attending: Internal Medicine

## 2019-03-23 DIAGNOSIS — Z23 Encounter for immunization: Secondary | ICD-10-CM | POA: Insufficient documentation

## 2019-03-23 NOTE — Progress Notes (Signed)
   Covid-19 Vaccination Clinic  Name:  Ariel Johnson    MRN: [id number] DOB: 04-Aug-1950  03/23/2019  [name] was observed post Covid-19 immunization for 30 minutes based on pre-vaccination screening without incidence. She was provided with Vaccine Information Sheet and instruction to access the V-Safe system.   Ariel Johnson was instructed to call 911 with any severe reactions post vaccine: Marland Kitchen Difficulty breathing  . Swelling of your face and throat  . A fast heartbeat  . A bad rash all over your body  . Dizziness and weakness    Immunizations Administered    Name Date Dose VIS Date Route   Pfizer COVID-19 Vaccine 03/23/2019 11:43 AM 0.3 mL 01/29/2019 Intramuscular   Manufacturer: [place]   Lot: [id number]   [place]: [id number]

## 2019-03-29 DIAGNOSIS — E7800 Pure hypercholesterolemia, unspecified: Secondary | ICD-10-CM | POA: Diagnosis not present

## 2019-03-30 DIAGNOSIS — Z170 Estrogen receptor positive status [ER+]: Secondary | ICD-10-CM | POA: Diagnosis not present

## 2019-03-30 DIAGNOSIS — I82532 Chronic embolism and thrombosis of left popliteal vein: Secondary | ICD-10-CM | POA: Diagnosis not present

## 2019-03-30 DIAGNOSIS — C50911 Malignant neoplasm of unspecified site of right female breast: Secondary | ICD-10-CM | POA: Diagnosis not present

## 2019-03-30 DIAGNOSIS — Z0000 Encounter for general adult medical examination without abnormal findings: Secondary | ICD-10-CM | POA: Diagnosis not present

## 2019-03-30 DIAGNOSIS — Z1389 Encounter for screening for other disorder: Secondary | ICD-10-CM | POA: Diagnosis not present

## 2019-03-30 DIAGNOSIS — M8588 Other specified disorders of bone density and structure, other site: Secondary | ICD-10-CM | POA: Diagnosis not present

## 2019-03-31 ENCOUNTER — Other Ambulatory Visit: Payer: Self-pay

## 2019-03-31 ENCOUNTER — Encounter: Payer: Self-pay | Admitting: Physical Therapy

## 2019-03-31 ENCOUNTER — Ambulatory Visit: Payer: PPO | Admitting: Physical Therapy

## 2019-03-31 DIAGNOSIS — R278 Other lack of coordination: Secondary | ICD-10-CM

## 2019-03-31 DIAGNOSIS — N393 Stress incontinence (female) (male): Secondary | ICD-10-CM

## 2019-03-31 DIAGNOSIS — M6281 Muscle weakness (generalized): Secondary | ICD-10-CM

## 2019-03-31 NOTE — Therapy (Signed)
[facility] [address] [place], [place], [address] Phone: [phone]   Fax:  [phone]  Physical Therapy Treatment  Patient Details  Name: Ariel Johnson MRN: [id number] Date of Birth: March 31, 1950 Referring Provider (PT): Dr. Nicholas Lose   Encounter Date: 03/31/2019  PT End of Session - 03/31/19 1529    Visit Number  2    Date for PT Re-Evaluation  05/17/19    Authorization Type  [employer]    PT Start Time  3532    PT Stop Time  1525    PT Time Calculation (min)  40 min    Activity Tolerance  Patient tolerated treatment well;No increased pain    Behavior During Therapy  WFL for tasks assessed/performed       Past Medical History:  Diagnosis Date  . Ductal carcinoma in situ (DCIS) of left breast   . GERD (gastroesophageal reflux disease)   . Hepatitis   . History of cancer of left breast   . History of radiation therapy 01/21/19- 02/18/19   Right Breast 15 fx of 2.67 Gy each to total 40.05 Gy. Right Breast boost 5 fx of 2 Gy each to total 10 Gy    Past Surgical History:  Procedure Laterality Date  . BREAST BIOPSY Right   . BREAST BIOPSY Right   . BREAST LUMPECTOMY WITH RADIOACTIVE SEED AND SENTINEL LYMPH NODE BIOPSY Right 12/24/2018   Procedure: RIGHT BREAST LUMPECTOMY WITH RADIOACTIVE SEED AND RIGHT AXILLARY SENTINEL LYMPH NODE BIOPSY;  Surgeon: Alphonsa Overall, MD;  Location: [place];  Service: General;  Laterality: Right;  . CHOLECYSTECTOMY    . MASTECTOMY Left 1998  . PARTIAL COLECTOMY    . TUBAL LIGATION      There were no vitals filed for this visit.  Subjective Assessment - 03/31/19 1453    Subjective  The dryness is better. I have done th ecirculation massage to the vulva area and perineal body. The vaginal area does not feel as tight.    Pertinent History  Patient was diagnosed on 10/29/2018 with right grade II invasive ductal carcinoma breast cancer. Patient  underwent right lumpectomy and sentinel node biopsy (0/3 nodes positive) on 12/24/2018. It is ER/PR positive and HER2 negative with a Ki67 of 15%. She has a history of left breast cancer in 1998 with a mastectomy but it is unknown how many axillary nodes were removed. She reports never having left arm lymphedema.    Patient Stated Goals  reduce vaginal dryness    Currently in Pain?  No/denies                    Pelvic Floor Special Questions - 03/31/19 0001    Pelvic Floor Internal Exam  Patient confirms identification and approves PT to assess pelvic floor and treatment    Exam Type  Vaginal    Strength  weak squeeze, no lift        [facility] Adult PT Treatment/Exercise - 03/31/19 0001      Self-Care   Self-Care  Other Self-Care Comments    Other Self-Care Comments   education on bladder irritants and how they affect the bladder      Therapeutic Activites    Therapeutic Activities  Other Therapeutic Activities    Other Therapeutic Activities  instruction on correct toileting to relax the pelvic floor, breathing correctly, and correct position      Neuro Re-ed    Neuro Re-ed Details  with therapist finger in the vaginal canal tapping the sphincter muscles to promote pelvic floor contraction for 3 seconds 10x, hip adduction isometric with pelvic floor contraction holding for 5 sec 10x      Manual Therapy   Manual Therapy  Internal Pelvic Floor    Internal Pelvic Floor  left levator ani, perineal body and posterior fourchette             PT Education - 03/31/19 1529    Education Details  bladder irritants, toileting, diaphragmatic breathing, pelvic floor contraction,    Person(s) Educated  Patient    Methods  Explanation;Demonstration;Verbal cues;Handout    Comprehension  Returned demonstration;Verbalized understanding       PT Short Term Goals - 03/22/19 1508      PT SHORT TERM GOAL #1   Title  independent with initial HEP    Time  4    Period  Weeks     Status  New    Target Date  04/19/19      PT SHORT TERM GOAL #2   Title  understand vaginal health and how to improve quality of tissue    Time  4    Period  Weeks    Status  New    Target Date  04/19/19      PT SHORT TERM GOAL #3   Title  able to fully contract the pelvic floor with increased strength >/= 2/5    Time  4    Period  Weeks    Status  New    Target Date  04/19/19        PT Long Term Goals - 03/22/19 1510      PT LONG TERM GOAL #1   Title  independent with advanced HEP    Time  8    Period  Weeks    Status  New    Target Date  05/17/19      PT LONG TERM GOAL #2   Title  urinary leakage reduce >/= 75% due to improved strength >/= 3/5    Time  8    Period  Weeks    Status  New    Target Date  05/17/19      PT LONG TERM GOAL #3   Title  understand correct toileting to reduce strain on the pelvic floor    Time  8    Period  Weeks    Status  New    Target Date  05/17/19      PT LONG TERM GOAL #4   Title  able to walk to the commode without rushing due to urge to void reduced >/= 80%    Time  8    Period  Weeks    Status  New    Target Date  05/17/19            Plan - 03/31/19 1451    Clinical Impression Statement  Patient is doing her vaginal massage and using coconut oil. Patient feels the area is less dry. Patient pelvic floor strength is 2/5 and she is able to feel the contraction. Patient does hold her breath with pelvic floor contraction. Patient is learning diaphragmatic breathing to relax the pelvic floor with bowel movements. Patient will benefit from skilled therapy to improve pelvic floor strength, improve coordination and education on vaginal health.    Personal Factors and Comorbidities  Age;Comorbidity 2    Comorbidities  right estrogen positive Breast cancer 12/24/18 with radiation 12/3-12/31/20;  left masectomy 1998    Examination-Activity Limitations  Continence;Toileting    Examination-Participation Restrictions  Interpersonal  Relationship    Stability/Clinical Decision Making  Evolving/Moderate complexity    Rehab Potential  Excellent    PT Frequency  1x / week    PT Duration  8 weeks    PT Treatment/Interventions  ADLs/Self Care Home Management;Biofeedback;Neuromuscular re-education;Therapeutic exercise;Therapeutic activities;Patient/family education;Manual techniques;Dry needling    PT Next Visit Plan  hip stretches, pelvic floor contraction in supine holding for 5 sec, quick flicks, clam in supine    Recommended Other Services  MD signed initial eval    Consulted and Agree with Plan of Care  Patient       Patient will benefit from skilled therapeutic intervention in order to improve the following deficits and impairments:  Decreased coordination, Increased fascial restricitons, Decreased endurance, Decreased strength  Visit Diagnosis: Muscle weakness (generalized)  Other lack of coordination  Stress incontinence (female) (female)     Problem List Patient Active Problem List   Diagnosis Date Noted  . Genetic testing 11/25/2018  . History of cancer of left breast   . Malignant neoplasm of upper-inner quadrant of right breast in female, estrogen receptor positive ([place]) 11/13/2018    Earlie Counts, PT 03/31/19 3:34 PM   [facility] [address] [place], [place], [address] Phone: [phone]   Fax:  [phone]  Name: ELFRIEDE BONINI MRN: [id number] Date of Birth: Jul 18, 1950

## 2019-03-31 NOTE — Patient Instructions (Addendum)
Certain foods and liquids will decrease the pH making the urine more acidic.  Urinary urgency increases when the urine has a low pH.  Most common irritants: alcohol, carbonated beverages and caffinated beverages, citrus fruits, tomatoes  Foods to avoid: apple juice, apples, ascorbic acid, canteloupes, chili, citrus fruits, coffee, cranberries, grapes, guava, peaches, pepper, pineapple, plums, strawberries, tea, tomatoes, and vinegar.  Drinking plenty of water may help to increase the pH and dilute out any of the effects of specific irritants.  Foods that are NOT irritating to the bladder include: Pears, papayas, sun-brewed teas, watermelons, non-citrus herbal teas, apricots, kava and low-acid instant drinks (Postum)   Toileting Techniques for Bowel Movements (Defecation) Using your belly (abdomen) and pelvic floor muscles to have a bowel movement is usually instinctive.  Sometimes people can have problems with these muscles and have to relearn proper defecation (emptying) techniques.  If you have weakness in your muscles, organs that are falling out, decreased sensation in your pelvis, or ignore your urge to go, you may find yourself straining to have a bowel movement.  You are straining if you are: . holding your breath or taking in a huge gulp of air and holding it  . keeping your lips and jaw tensed and closed tightly . turning red in the face because of excessive pushing or forcing . developing or worsening your  hemorrhoids . getting faint while pushing . not emptying completely and have to defecate many times a day  If you are straining, you are actually making it harder for yourself to have a bowel movement.  Many people find they are pulling up with the pelvic floor muscles and closing off instead of opening the anus. Due to lack pelvic floor relaxation and coordination the abdominal muscles, one has to work harder to push the feces out.  Many people have never been taught how to  defecate efficiently and effectively.  Notice what happens to your body when you are having a bowel movement.  While you are sitting on the toilet pay attention to the following areas: . Jaw and mouth position . Angle of your hips   . Whether your feet touch the ground or not . Arm placement  . Spine position . Waist . Belly tension . Anus (opening of the anal canal)  An Evacuation/Defecation Plan   Here are the 4 basic points:  1. Lean forward enough for your elbows to rest on your knees 2. Support your feet on the floor or use a low stool if your feet don't touch the floor  3. Push out your belly as if you have swallowed a beach ball--you should feel a widening of your waist 4. Open and relax your pelvic floor muscles, rather than tightening around the anus       The following conditions my require modifications to your toileting posture:  . If you have had surgery in the past that limits your back, hip, pelvic, knee or ankle flexibility . Constipation   Your healthcare practitioner may make the following additional suggestions and adjustments:  1) Sit on the toilet  a) Make sure your feet are supported. b) Notice your hip angle and spine position--most people find it effective to lean forward or raise their knees, which can help the muscles around the anus to relax  c) When you lean forward, place your forearms on your thighs for support  2) Relax suggestions a) Breath deeply in through your nose and out slowly through your mouth as  if you are smelling the flowers and blowing out the candles. b) To become aware of how to relax your muscles, contracting and releasing muscles can be helpful.  Pull your pelvic floor muscles in tightly by using the image of holding back gas, or closing around the anus (visualize making a circle smaller) and lifting the anus up and in.  Then release the muscles and your anus should drop down and feel open. Repeat 5 times ending with the feeling of  relaxation. c) Keep your pelvic floor muscles relaxed; let your belly bulge out. d) The digestive tract starts at the mouth and ends at the anal opening, so be sure to relax both ends of the tube.  Place your tongue on the roof of your mouth with your teeth separated.  This helps relax your mouth and will help to relax the anus at the same time.  3) Empty (defecation) a) Keep your pelvic floor and sphincter relaxed, then bulge your anal muscles.  Make the anal opening wide.  b) Stick your belly out as if you have swallowed a beach ball. c) Make your belly wall hard using your belly muscles while continuing to breathe. Doing this makes it easier to open your anus. d) Breath out and give a grunt (or try using other sounds such as ahhhh, shhhhh, ohhhh or grrrrrrr).  4) Finish a) As you finish your bowel movement, pull the pelvic floor muscles up and in.  This will leave your anus in the proper place rather than remaining pushed out and down. If you leave your anus pushed out and down, it will start to feel as though that is normal and give you incorrect signals about needing to have a bowel movement.   Sitting    Sit comfortably. Allow body's muscles to relax. Place hands on belly. Inhale slowly and deeply for _3__ seconds, so hands move out. Then take _3__ seconds to exhale. Repeat __5_ times. Do __1_ times a day.  Copyright  VHI. All rights reserved.  Adduction: Hip - Knees Together With Pelvic Floor (Hook-Lying)    Lie with hips and knees bent, towel roll between knees. Squeeze pelvic floor while pushing knees together. Hold for __5_ seconds. Rest for _5__ seconds. Repeat _5__ times. Do _2__ times a day.   Copyright  VHI. All rights reserved.  Slow Contraction: Gravity Eliminated (Hook-Lying)    Lie with hips and knees bent. Slowly squeeze pelvic floor for _3__ seconds. Rest for _5__ seconds. Repeat _5__ times. Do __2_ times a day.   Copyright  VHI. All rights reserved.    [facility] [address] [place], [place] [address] Phone # [phone] Fax [phone]

## 2019-04-01 ENCOUNTER — Other Ambulatory Visit: Payer: Self-pay | Admitting: Internal Medicine

## 2019-04-01 DIAGNOSIS — M8588 Other specified disorders of bone density and structure, other site: Secondary | ICD-10-CM

## 2019-04-02 ENCOUNTER — Other Ambulatory Visit: Payer: Self-pay | Admitting: Hematology and Oncology

## 2019-04-07 ENCOUNTER — Encounter: Payer: Self-pay | Admitting: Physical Therapy

## 2019-04-07 ENCOUNTER — Other Ambulatory Visit: Payer: Self-pay

## 2019-04-07 ENCOUNTER — Ambulatory Visit: Payer: PPO | Admitting: Physical Therapy

## 2019-04-07 DIAGNOSIS — Z483 Aftercare following surgery for neoplasm: Secondary | ICD-10-CM

## 2019-04-07 DIAGNOSIS — M6281 Muscle weakness (generalized): Secondary | ICD-10-CM

## 2019-04-07 DIAGNOSIS — N393 Stress incontinence (female) (male): Secondary | ICD-10-CM

## 2019-04-07 DIAGNOSIS — R278 Other lack of coordination: Secondary | ICD-10-CM

## 2019-04-07 NOTE — Therapy (Signed)
[facility] [address], [place], [place], [address] Phone: [phone]   Fax:  [phone]  Physical Therapy Treatment  Patient Details  Name: Ariel Johnson MRN: [id number] Date of Birth: 04/28/1950 Referring Provider (PT): Dr. Nicholas Lose   Encounter Date: 04/07/2019  PT End of Session - 04/07/19 1532    Visit Number  3    Date for PT Re-Evaluation  05/17/19    Authorization Type  [employer]    PT Start Time  5726    PT Stop Time  1527    PT Time Calculation (min)  42 min    Activity Tolerance  Patient tolerated treatment well;No increased pain    Behavior During Therapy  WFL for tasks assessed/performed       Past Medical History:  Diagnosis Date  . Ductal carcinoma in situ (DCIS) of left breast   . GERD (gastroesophageal reflux disease)   . Hepatitis   . History of cancer of left breast   . History of radiation therapy 01/21/19- 02/18/19   Right Breast 15 fx of 2.67 Gy each to total 40.05 Gy. Right Breast boost 5 fx of 2 Gy each to total 10 Gy    Past Surgical History:  Procedure Laterality Date  . BREAST BIOPSY Right   . BREAST BIOPSY Right   . BREAST LUMPECTOMY WITH RADIOACTIVE SEED AND SENTINEL LYMPH NODE BIOPSY Right 12/24/2018   Procedure: RIGHT BREAST LUMPECTOMY WITH RADIOACTIVE SEED AND RIGHT AXILLARY SENTINEL LYMPH NODE BIOPSY;  Surgeon: Alphonsa Overall, MD;  Location: [facility];  Service: General;  Laterality: Right;  . CHOLECYSTECTOMY    . MASTECTOMY Left 1998  . PARTIAL COLECTOMY    . TUBAL LIGATION      There were no vitals filed for this visit.  Subjective Assessment - 04/07/19 1453    Subjective  The leakage while going to the commode first thing in the morning is better. When I drink coffee and caffienated tea I will leak.    Pertinent History  Patient was diagnosed on 10/29/2018 with right grade II invasive ductal carcinoma breast cancer. Patient  underwent right lumpectomy and sentinel node biopsy (0/3 nodes positive) on 12/24/2018. It is ER/PR positive and HER2 negative with a Ki67 of 15%. She has a history of left breast cancer in 1998 with a mastectomy but it is unknown how many axillary nodes were removed. She reports never having left arm lymphedema.    Patient Stated Goals  reduce vaginal dryness    Currently in Pain?  No/denies    Multiple Pain Sites  No                    Pelvic Floor Special Questions - 04/07/19 0001    Pelvic Floor Internal Exam  Patient confirms identification and approves PT to assess pelvic floor and treatment    Exam Type  Vaginal    Strength  weak squeeze, no lift        [facility] Adult PT Treatment/Exercise - 04/07/19 0001      Self-Care   Self-Care  Other Self-Care Comments    Other Self-Care Comments   discussed bladder irritant and how caffiene is her irritant, educated patient on going to the bathroom every 3 hours; instruction on vaginal lubricants and how to use them      Neuro Re-ed    Neuro Re-ed Details   using 2 fingers to facilitate a vaginal pelvic floor contraction and  holding for 5 seconds with VC to not lift chest and imagine the pelvic floor pulling up a diamond ring and to fully relax before contraction      Manual Therapy   Manual Therapy  Internal Pelvic Floor    Internal Pelvic Floor  left levator ani, perineal body and posterior fourchette             PT Education - 04/07/19 1506    Education Details  information on lubricants    Person(s) Educated  Patient    Methods  Explanation;Demonstration;Handout    Comprehension  Verbalized understanding;Returned demonstration       PT Short Term Goals - 04/07/19 1535      PT SHORT TERM GOAL #1   Title  independent with initial HEP    Time  4    Period  Weeks    Status  Achieved      PT SHORT TERM GOAL #2   Title  understand vaginal health and how to improve quality of tissue    Time  4    Period  Weeks     Status  Achieved      PT SHORT TERM GOAL #3   Title  able to fully contract the pelvic floor with increased strength >/= 2/5    Time  4    Period  Weeks    Status  Achieved        PT Long Term Goals - 03/22/19 1510      PT LONG TERM GOAL #1   Title  independent with advanced HEP    Time  8    Period  Weeks    Status  New    [place] Date  05/17/19      PT LONG TERM GOAL #2   Title  urinary leakage reduce >/= 75% due to improved strength >/= 3/5    Time  8    Period  Weeks    Status  New    Target Date  05/17/19      PT LONG TERM GOAL #3   Title  understand correct toileting to reduce strain on the pelvic floor    Time  8    Period  Weeks    Status  New    Target Date  05/17/19      PT LONG TERM GOAL #4   Title  able to walk to the commode without rushing due to urge to void reduced >/= 80%    Time  8    Period  Weeks    Status  New    Target Date  05/17/19            Plan - 04/07/19 1532    Clinical Impression Statement  Patient leaks less urine when she walks to the bathroom after waking up. Patient pelvic floor strength is 2/5 needing verbal cues to fully relax and then contract. Patient understands what lubricants are and which ones to use. Patient understands good vaginal health. Patient will leak urine with caffiene and she understands that is her bladder irritant. Patient will benefit from skilled therapy to improve pelvic floor strength, improve coordination and education on vaginal health.    Personal Factors and Comorbidities  Age;Comorbidity 2    Comorbidities  right estrogen positive Breast cancer 12/24/18 with radiation 12/3-12/31/20; left masectomy 1998    Examination-Activity Limitations  Continence;Toileting    Examination-Participation Restrictions  Interpersonal Relationship    Stability/Clinical Decision Making  Evolving/Moderate complexity  Rehab Potential  Excellent    PT Frequency  1x / week    PT Duration  8 weeks    PT  Treatment/Interventions  ADLs/Self Care Home Management;Biofeedback;Neuromuscular re-education;Therapeutic exercise;Therapeutic activities;Patient/family education;Manual techniques;Dry needling    PT Next Visit Plan  pelvic floor contraction in supine holding for 5 sec, quick flicks, clam in supine with pelvic floor emg    Consulted and Agree with Plan of Care  Patient       Patient will benefit from skilled therapeutic intervention in order to improve the following deficits and impairments:  Decreased coordination, Increased fascial restricitons, Decreased endurance, Decreased strength  Visit Diagnosis: Muscle weakness (generalized)  Other lack of coordination  Stress incontinence (female) (female)  Aftercare following surgery for neoplasm     Problem List Patient Active Problem List   Diagnosis Date Noted  . Genetic testing 11/25/2018  . History of cancer of left breast   . Malignant neoplasm of upper-inner quadrant of right breast in female, estrogen receptor positive ([place]) 11/13/2018    Earlie Counts, PT 04/07/19 3:36 PM   [facility] [address] [place], [place], [address] Phone: [phone]   Fax:  [phone]  Name: Ariel Johnson MRN: [id number] Date of Birth: [birth year]

## 2019-04-07 NOTE — Patient Instructions (Addendum)
  Lubrication . Used for intercourse to reduce friction . Avoid ones that have glycerin, warming gels, tingling gels, icing or cooling gel, scented . Avoid parabens due to a preservative similar to female sex hormone . May need to be reapplied once or several times during sexual activity . Can be applied to both partners genitals prior to vaginal penetration to minimize friction or irritation . Prevent irritation and mucosal tears that cause post coital pain and increased the risk of vaginal and urinary tract infections . Oil-based lubricants cannot be used with condoms due to breaking them down.  Least likely to irritate vaginal tissue.  . Plant based-lubes are safe . Silicone-based lubrication are thicker and last long and used for post-menopausal women  Vaginal Lubricators Here is a list of some suggested lubricators you can use for intercourse. Use the most hypoallergenic product.  You can place on you or your partner.   Slippery Stuff ( water based)  Sylk or Sliquid Natural H2O ( good  if frequent UTI's)- [employer], amazon  Sliquid organics silk-(aloe and silicone based )  [employer] (www.blossom-organics.com)- (aloe based )  Coconut oil, olive oil -not good with condoms   PJur Woman Nude- (water based) amazon  Uberlube- ( silicon) Jane Lew has an organic one  Yes lubricant- (water based and has plant oil based similar to silicone) [employer] Platinum-Silicone, [employer], [employer]  Olive and Bee intimate cream-  www.oliveandbee.com.au  [place] Things to avoid in lubricants are glycerin, warming gels, tingling gels, icing or cooling  gels, and scented gels.  Also avoid Vaseline. KY jelly, Replens, and Astroglide kills good bacteria(lactobacilli) .       [facility] [address] [place], [place] [address] Phone # [phone] Fax [phone]

## 2019-04-12 ENCOUNTER — Other Ambulatory Visit: Payer: Self-pay

## 2019-04-12 ENCOUNTER — Ambulatory Visit
Admission: RE | Admit: 2019-04-12 | Discharge: 2019-04-12 | Disposition: A | Discharge status: Home / Self Care | Payer: PPO | Source: Ambulatory Visit | Attending: Internal Medicine | Admitting: Internal Medicine

## 2019-04-12 DIAGNOSIS — M85852 Other specified disorders of bone density and structure, left thigh: Secondary | ICD-10-CM | POA: Diagnosis not present

## 2019-04-12 DIAGNOSIS — M8588 Other specified disorders of bone density and structure, other site: Secondary | ICD-10-CM

## 2019-04-12 DIAGNOSIS — Z780 Asymptomatic menopausal state: Secondary | ICD-10-CM | POA: Diagnosis not present

## 2019-04-14 ENCOUNTER — Ambulatory Visit: Payer: PPO | Admitting: Physical Therapy

## 2019-04-14 ENCOUNTER — Encounter: Payer: Self-pay | Admitting: Physical Therapy

## 2019-04-14 ENCOUNTER — Other Ambulatory Visit: Payer: Self-pay

## 2019-04-14 DIAGNOSIS — M6281 Muscle weakness (generalized): Secondary | ICD-10-CM | POA: Diagnosis not present

## 2019-04-14 DIAGNOSIS — Z483 Aftercare following surgery for neoplasm: Secondary | ICD-10-CM

## 2019-04-14 DIAGNOSIS — N393 Stress incontinence (female) (male): Secondary | ICD-10-CM

## 2019-04-14 DIAGNOSIS — R278 Other lack of coordination: Secondary | ICD-10-CM

## 2019-04-14 NOTE — Therapy (Addendum)
[facility] [address] [place], [place], [address] Phone: [phone]   Fax:  [phone]  Physical Therapy Treatment  Patient Details  Name: Ariel Johnson MRN: [id number] Date of Birth: [birth year] Referring Provider (PT): Dr. Nicholas Lose   Encounter Date: 04/14/2019  PT End of Session - 04/14/19 1451    Visit Number  4    Date for PT Re-Evaluation  05/17/19    Authorization Type  [employer]    PT Start Time  1450    PT Stop Time  1530    PT Time Calculation (min)  40 min    Activity Tolerance  Patient tolerated treatment well;No increased pain    Behavior During Therapy  WFL for tasks assessed/performed       Past Medical History:  Diagnosis Date  . Ductal carcinoma in situ (DCIS) of left breast   . GERD (gastroesophageal reflux disease)   . Hepatitis   . History of cancer of left breast   . History of radiation therapy 01/21/19- 02/18/19   Right Breast 15 fx of 2.67 Gy each to total 40.05 Gy. Right Breast boost 5 fx of 2 Gy each to total 10 Gy    Past Surgical History:  Procedure Laterality Date  . BREAST BIOPSY Right   . BREAST BIOPSY Right   . BREAST LUMPECTOMY WITH RADIOACTIVE SEED AND SENTINEL LYMPH NODE BIOPSY Right 12/24/2018   Procedure: RIGHT BREAST LUMPECTOMY WITH RADIOACTIVE SEED AND RIGHT AXILLARY SENTINEL LYMPH NODE BIOPSY;  Surgeon: Alphonsa Overall, MD;  Location: [facility];  Service: General;  Laterality: Right;  . CHOLECYSTECTOMY    . MASTECTOMY Left 1998  . PARTIAL COLECTOMY    . TUBAL LIGATION      There were no vitals filed for this visit.  Subjective Assessment - 04/14/19 1452    Subjective  The urinary leakage is 30% better.    Patient Stated Goals  reduce vaginal dryness    Currently in Pain?  No/denies                    Pelvic Floor Special Questions - 04/14/19 0001    Biofeedback  quick flicks in sitting 16 uv, sidely squeeze  ball aonc contract the pelvic floor; supine hip flexion isometrics with pelvic floor contraction    Biofeedback sensor type  Surface   vaginal       [facility] Adult PT Treatment/Exercise - 04/14/19 0001      Self-Care   Self-Care  Other Self-Care Comments    Other Self-Care Comments   discussed further about using the cocnut oil in the vagina 2-3 times per week and outside the vulva and labia daily      Manual Therapy   Manual Therapy  Soft tissue mobilization    Soft tissue mobilization  circular massage to abdomen to promote peristalic motion of the intestines and educated patient on how to perform herself             PT Education - 04/14/19 1527    Education Details  abdominal massage; pelvic floor contraction is sidely and supine    Person(s) Educated  Patient    Methods  Demonstration;Handout;Explanation    Comprehension  Verbalized understanding;Returned demonstration       PT Short Term Goals - 04/07/19 1535      PT SHORT TERM GOAL #1   Title  independent with initial HEP    Time  4  Period  Weeks    Status  Achieved      PT SHORT TERM GOAL #2   Title  understand vaginal health and how to improve quality of tissue    Time  4    Period  Weeks    Status  Achieved      PT SHORT TERM GOAL #3   Title  able to fully contract the pelvic floor with increased strength >/= 2/5    Time  4    Period  Weeks    Status  Achieved        PT Long Term Goals - 04/14/19 1533      PT LONG TERM GOAL #1   Title  independent with advanced HEP    Time  8    Period  Weeks    Status  On-going      PT LONG TERM GOAL #2   Title  urinary leakage reduce >/= 75% due to improved strength >/= 3/5    Time  8    Period  Weeks    Status  On-going      PT LONG TERM GOAL #3   Title  understand correct toileting to reduce strain on the pelvic floor    Time  8    Period  Weeks    Status  On-going      PT LONG TERM GOAL #4   Title  able to walk to the commode without rushing  due to urge to void reduced >/= 80%    Time  8    Period  Weeks    Status  On-going            Plan - 04/14/19 1452    Clinical Impression Statement  Patient reports her urinary leakage is 30% better. Patient is noticing dryness in the vulva area more and was educated on using the coconut oil daily on the vulva and labia and in the vaginal canal 2 times per week. Educated patient on abdominal massage to promote peristalic motion of the intestines. Patient does better with pelvic floor contraction in sidley with ball squeeze for holding 5 seconds. Pelvic floor resting tone is 2 uv. Patient is doing hip flexion isometrics in supine with pelvic floor contraction and to engage the lower abdominals. Patient will benefit from skilled therapy to improve pelvic floor strength and improve coordination.    Personal Factors and Comorbidities  Age;Comorbidity 2    Comorbidities  right estrogen positive Breast cancer 12/24/18 with radiation 12/3-12/31/20; left masectomy 1998    Examination-Activity Limitations  Continence;Toileting    Examination-Participation Restrictions  Interpersonal Relationship    Stability/Clinical Decision Making  Evolving/Moderate complexity    Rehab Potential  Excellent    PT Frequency  1x / week    PT Duration  8 weeks    PT Treatment/Interventions  ADLs/Self Care Home Management;Biofeedback;Neuromuscular re-education;Therapeutic exercise;Therapeutic activities;Patient/family education;Manual techniques;Dry needling    PT Next Visit Plan  pelvic floor contraction with EMG, abdominal massage and bulging of the pelvic floor, diaphragmatic breathing    Consulted and Agree with Plan of Care  Patient       Patient will benefit from skilled therapeutic intervention in order to improve the following deficits and impairments:  Decreased coordination, Increased fascial restricitons, Decreased endurance, Decreased strength  Visit Diagnosis: Muscle weakness (generalized)  Other  lack of coordination  Stress incontinence (female) (female)  Aftercare following surgery for neoplasm     Problem List Patient Active Problem List  Diagnosis Date Noted  . Genetic testing 11/25/2018  . History of cancer of left breast   . Malignant neoplasm of upper-inner quadrant of right breast in female, estrogen receptor positive ([place]) 11/13/2018    Earlie Counts, PT 04/14/19 3:34 PM   [facility] [address] [place], [place], [address] Phone: [phone]   Fax:  [phone]  Name: Ariel Johnson MRN: [id number] Date of Birth: 08/12/1950  PHYSICAL THERAPY DISCHARGE SUMMARY  Visits from Start of Care: 4  Current functional level related to goals / functional outcomes: See above. Patient has not returned since her last visit.    Remaining deficits: See above.    Education / Equipment: HEP Plan: Patient agrees to discharge.  Patient goals were not met. Patient is being discharged due to not returning since the last visit. Thank you for the referral. Earlie Counts, PT 07/29/19 10:28 AM   ?????

## 2019-04-14 NOTE — Patient Instructions (Addendum)
Adduction: Hip - Knees Together With Pelvic Floor (Side-Lying)    Lie on left side, hips and knees slightly bent, towel roll between knees. Squeeze pelvic floor while pushing knees together. Hold for _5__ seconds. Rest for _5__ seconds. Repeat _10__ times. Do _2__ times a day.   Copyright  VHI. All rights reserved.  Unilateral Isometric Hip Flexion    Tighten stomach contract pelvic floor and raise right knee to outstretched arm. Push gently, keeping arm straight, trunk rigid. Hold __5__ seconds. Repeat __5__ times each leg per set.  Do __2__ sessions per day.  http://orth.exer.us/1099   Copyright  VHI. All rights reserved.    About Abdominal Massage  Abdominal massage, also called external colon massage, is a self-treatment circular massage technique that can reduce and eliminate gas and ease constipation. The colon naturally contracts in waves in a clockwise direction starting from inside the right hip, moving up toward the ribs, across the belly, and down inside the left hip.  When you perform circular abdominal massage, you help stimulate your colon's normal wave pattern of movement called peristalsis.  It is most beneficial when done after eating.  Positioning You can practice abdominal massage with oil while lying down, or in the shower with soap.  Some people find that it is just as effective to do the massage through clothing while sitting or standing.  How to Massage Start by placing your finger tips or knuckles on your right side, just inside your hip bone.  . Make small circular movements while you move upward toward your rib cage.   . Once you reach the bottom right side of your rib cage, take your circular movements across to the left side of the bottom of your rib cage.  . Next, move downward until you reach the inside of your left hip bone.  This is the path your feces travel in your colon. . Continue to perform your abdominal massage in this pattern for 10 minutes  each day.     You can apply as much pressure as is comfortable in your massage.  Start gently and build pressure as you continue to practice.  Notice any areas of pain as you massage; areas of slight pain may be relieved as you massage, but if you have areas of significant or intense pain, consult with your healthcare provider.  Other Considerations . General physical activity including bending and stretching can have a beneficial massage-like effect on the colon.  Deep breathing can also stimulate the colon because breathing deeply activates the same nervous system that supplies the colon.   . Abdominal massage should always be used in combination with a bowel-conscious diet that is high in the proper type of fiber for you, fluids (primarily water), and a regular exercise program.   [facility] [address] [place], [place] [address] Phone # [phone] Fax [phone]

## 2019-04-21 ENCOUNTER — Encounter: Payer: PPO | Admitting: Physical Therapy

## 2019-05-12 ENCOUNTER — Other Ambulatory Visit: Payer: Self-pay

## 2019-05-12 ENCOUNTER — Encounter: Payer: Self-pay | Admitting: Adult Health

## 2019-05-12 ENCOUNTER — Inpatient Hospital Stay: Payer: PPO | Attending: Hematology and Oncology | Admitting: Adult Health

## 2019-05-12 VITALS — BP 135/51 | HR 75 | Temp 98.7°F | Resp 16 | Ht 64.0 in | Wt 150.6 lb

## 2019-05-12 DIAGNOSIS — Z79811 Long term (current) use of aromatase inhibitors: Secondary | ICD-10-CM | POA: Insufficient documentation

## 2019-05-12 DIAGNOSIS — Z7901 Long term (current) use of anticoagulants: Secondary | ICD-10-CM | POA: Insufficient documentation

## 2019-05-12 DIAGNOSIS — Z79899 Other long term (current) drug therapy: Secondary | ICD-10-CM | POA: Insufficient documentation

## 2019-05-12 DIAGNOSIS — C50211 Malignant neoplasm of upper-inner quadrant of right female breast: Secondary | ICD-10-CM | POA: Insufficient documentation

## 2019-05-12 DIAGNOSIS — M8580 Other specified disorders of bone density and structure, unspecified site: Secondary | ICD-10-CM | POA: Insufficient documentation

## 2019-05-12 DIAGNOSIS — Z170 Estrogen receptor positive status [ER+]: Secondary | ICD-10-CM | POA: Insufficient documentation

## 2019-05-12 DIAGNOSIS — K219 Gastro-esophageal reflux disease without esophagitis: Secondary | ICD-10-CM | POA: Diagnosis not present

## 2019-05-12 DIAGNOSIS — Z923 Personal history of irradiation: Secondary | ICD-10-CM | POA: Insufficient documentation

## 2019-05-12 NOTE — Progress Notes (Signed)
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 Biopsy showed IDC with DCIS, grade 2, HER-2 - (1+), ER+ 95%, PR+ 95%, Ki67 15%.   11/18/2018 Cancer Staging   Staging form: Breast, AJCC 8th Edition - Clinical stage from 11/18/2018: Stage IB (cT2, cN0, cM0, G2, ER+, PR+, HER2-)    11/25/2018 Genetic Testing   Negative genetic testing on the [facility] Breast Cancer STAT panel and the Common Hereditary Cancers panel. The report date is 11/25/2018.  The STAT Breast cancer panel offered by [facility] includes sequencing and rearrangement analysis for the following 9 genes:  ATM, BRCA1, BRCA2, CDH1, CHEK2, PALB2, PTEN, STK11 and TP53.  The Common Hereditary Cancers Panel offered by [facility] includes sequencing and/or deletion duplication testing of the following 48 genes: APC, ATM, AXIN2, BARD1, BMPR1A, BRCA1, BRCA2, BRIP1, CDH1, CDK4, CDKN2A (p14ARF), CDKN2A (p16INK4a), CHEK2, CTNNA1, DICER1, EPCAM (Deletion/duplication testing only), GREM1 (promoter region deletion/duplication testing only), KIT, MEN1, MLH1, MSH2, MSH3, MSH6, MUTYH, NBN, NF1, NHTL1, PALB2, PDGFRA, PMS2, POLD1, POLE, PTEN, RAD50, RAD51C, RAD51D, RNF43, SDHB, SDHC, SDHD, SMAD4, SMARCA4. STK11, TP53, TSC1, TSC2, and VHL.  The following genes were evaluated for sequence changes only: SDHA and HOXB13 c.251G>A variant only.    12/24/2018 Surgery   Right lumpectomy Lucia Gaskins) [phone]): IDC with DCIS, 3.1cm, grade 2, clear margins, 3 right axillary lymph nodes negative.  ER 95%, PR 95%, Ki-67 15%, HER-2 negative   12/24/2018 Oncotype testing   Oncotype: score 10, 3% chance of distant recurrence in 9 years with tamoxifen alone.    12/31/2018 Cancer Staging   Staging form: Breast, AJCC 8th Edition -  Pathologic stage from 12/31/2018: Stage IA (pT2, pN0, cM0, G2, ER+, PR+, HER2-)    01/22/2019 - 02/18/2019 Radiation Therapy   The patient initially received a dose of 40.05 Gy in 15 fractions to the breast using whole-breast tangent fields. This was delivered using a 3-D conformal technique. The pt received a boost delivering an additional 10 Gy in 5 fractions using a electron boost with 87mV electrons. The total dose was 50.05 Gy.    02/2019 - 02/2024 Anti-estrogen oral therapy   Anastrozole     INTERVAL HISTORY:  Ariel Johnson review her survivorship care plan detailing her treatment course for breast cancer, as well as monitoring long-term side effects of that treatment, education regarding health maintenance, screening, and overall wellness and health promotion.     Overall, Ariel Johnson reports feeling quite well.  She is taking Anastrozole daily and tolerates it well other than an occasional hot flash. She completed survivorship survey.  Results are scanned.  She has occasional short term memory changes, but thinks it is related to increased stress with moving and getting married.  REVIEW OF SYSTEMS:  Review of Systems  Constitutional: Negative for appetite change, chills, fatigue, fever and unexpected weight change.  HENT:   Negative for hearing loss, lump/mass and trouble swallowing.   Eyes: Negative for eye problems and icterus.  Respiratory: Negative for chest tightness, cough and shortness of breath.   Cardiovascular: Negative for chest pain, leg swelling and palpitations.  Gastrointestinal: Negative for abdominal distention, abdominal pain, constipation, diarrhea, nausea and vomiting.  Endocrine: Negative for hot flashes.  Musculoskeletal: Negative for arthralgias.  Skin: Negative for itching and rash.  Neurological: Negative for dizziness,  extremity weakness, headaches and numbness.  Hematological: Negative for adenopathy. Does not bruise/bleed easily.   Psychiatric/Behavioral: Negative for depression. The patient is not nervous/anxious.   Breast: Denies any new nodularity, masses, tenderness, nipple changes, or nipple discharge.      ONCOLOGY TREATMENT TEAM:  1. Surgeon:  Dr. Lucia Gaskins at [facility] 2. Medical Oncologist: Dr. Lindi Adie  3. Radiation Oncologist: Dr. Isidore Moos    PAST MEDICAL/SURGICAL HISTORY:  Past Medical History:  Diagnosis Date  . Ductal carcinoma in situ (DCIS) of left breast   . GERD (gastroesophageal reflux disease)   . Hepatitis   . History of cancer of left breast   . History of radiation therapy 01/21/19- 02/18/19   Right Breast 15 fx of 2.67 Gy each to total 40.05 Gy. Right Breast boost 5 fx of 2 Gy each to total 10 Gy   Past Surgical History:  Procedure Laterality Date  . BREAST BIOPSY Right   . BREAST BIOPSY Right   . BREAST LUMPECTOMY WITH RADIOACTIVE SEED AND SENTINEL LYMPH NODE BIOPSY Right 12/24/2018   Procedure: RIGHT BREAST LUMPECTOMY WITH RADIOACTIVE SEED AND RIGHT AXILLARY SENTINEL LYMPH NODE BIOPSY;  Surgeon: Alphonsa Overall, MD;  Location: [facility];  Service: General;  Laterality: Right;  . CHOLECYSTECTOMY    . MASTECTOMY Left 1998  . PARTIAL COLECTOMY    . TUBAL LIGATION       ALLERGIES:  Allergies  Allergen Reactions  . Sulfamethoxazole Hives  . Sulfur Itching     CURRENT MEDICATIONS:  Outpatient Encounter Medications as of 05/12/2019  Medication Sig  . anastrozole (ARIMIDEX) 1 MG tablet Take 1 tablet (1 mg total) by mouth daily.  . Calcium Carbonate-Vit D-Min (CALTRATE 600+D PLUS PO) Take by mouth daily.  Marland Kitchen DOCUSATE SODIUM PO Take 50 mg by mouth as needed.  . Magnesium 250 MG TABS Take by mouth as needed.  . Multiple Vitamin (MULTIVITAMIN) tablet Take 1 tablet by mouth daily.  Marland Kitchen omeprazole (PRILOSEC) 20 MG capsule Take 20 mg by mouth daily.  . Probiotic Product (PROBIOTIC DAILY PO) Take by mouth.  . Turmeric (QC TUMERIC COMPLEX PO) Take 1,400 mg by mouth  daily.  Marland Kitchen UNABLE TO FIND [place] Apple Cider Gummies   For Acid Reflux  . XARELTO 20 MG TABS tablet TAKE 1 TABLET BY MOUTH EVERY DAY WITH SUPPER   No facility-administered encounter medications on file as of 05/12/2019.     ONCOLOGIC FAMILY HISTORY:  Family History  Problem Relation Age of Onset  . Colon cancer Father 60       possible colon cancer  . Heart disease Maternal Grandmother   . Heart disease Maternal Grandfather   . Arthritis Paternal Grandmother      GENETIC COUNSELING/TESTING: See above  SOCIAL HISTORY:  Social History   Socioeconomic History  . Marital status: Married    Spouse name: Not on file  . Number of children: Not on file  . Years of education: Not on file  . Highest education level: Not on file  Occupational History  . Not on file  Tobacco Use  . Smoking status: Never Smoker  . Smokeless tobacco: Never Used  Substance and Sexual Activity  . Alcohol use: Not Currently  . Drug use: Never  . Sexual activity: Not on file  Other Topics Concern  . Not on file  Social History Narrative  . Not on file   Social Determinants of Health   Financial Resource Strain:   . Difficulty of Paying  Living Expenses:   Food Insecurity:   . Worried About Charity fundraiser in the Last Year:   . Arboriculturist in the Last Year:   Transportation Needs: No Transportation Needs  . Lack of Transportation (Medical): No  . Lack of Transportation (Non-Medical): No  Physical Activity:   . Days of Exercise per Week:   . Minutes of Exercise per Session:   Stress:   . Feeling of Stress :   Social Connections:   . Frequency of Communication with Friends and Family:   . Frequency of Social Gatherings with Friends and Family:   . Attends Religious Services:   . Active Member of Clubs or Organizations:   . Attends Archivist Meetings:   Marland Kitchen Marital Status:   Intimate Partner Violence: Not At Risk  . Fear of Current or Ex-Partner: No  . Emotionally Abused:  No  . Physically Abused: No  . Sexually Abused: No     OBSERVATIONS/OBJECTIVE:  BP (!) 135/51   Pulse 75   Temp 98.7 F (37.1 C) (Temporal)   Resp 16   Ht 5' 4"  (1.626 m)   Wt 150 lb 9.6 oz (68.3 kg)   SpO2 99%   BMI 25.85 kg/m  GENERAL: Patient is a well appearing female in no acute distress HEENT:  Sclerae anicteric.  Oropharynx clear and moist. No ulcerations or evidence of oropharyngeal candidiasis. Neck is supple.  NODES:  No cervical, supraclavicular, or axillary lymphadenopathy palpated.  BREAST EXAM:  Left breast s/p mastectomy and reconstruction, right breast s/p lumpectomy and radiation, no sign of recurrence LUNGS:  Clear to auscultation bilaterally.  No wheezes or rhonchi. HEART:  Regular rate and rhythm. No murmur appreciated. ABDOMEN:  Soft, nontender.  Positive, normoactive bowel sounds. No organomegaly palpated. MSK:  No focal spinal tenderness to palpation. Full range of motion bilaterally in the upper extremities. EXTREMITIES:  No peripheral edema.   SKIN:  Clear with no obvious rashes or skin changes. No nail dyscrasia. NEURO:  Nonfocal. Well oriented.  Appropriate affect.   LABORATORY DATA:  None for this visit.  DIAGNOSTIC IMAGING:  None for this visit.      ASSESSMENT AND PLAN:  Ms.. Ariel Johnson is a pleasant 69 y.o. female with Stage IA right breast invasive ductal carcinoma, ER+/PR+/HER2-, diagnosed in 10/2018, treated with lumpectomy, adjuvant radiation therapy, and anti-estrogen therapy with Anastrozole beginning in 02/2019.  She presents to the Survivorship Clinic for our initial meeting and routine follow-up post-completion of treatment for breast cancer.    1. Stage IA right breast cancer:  [name] is continuing to recover from definitive treatment for breast cancer. She will follow-up with her medical oncologist, Dr. Lindi Adie in 6 months with history and physical exam per surveillance protocol.  She will continue her anti-estrogen therapy with  Anastrozole. Thus far, she is tolerating the Anastrozole well, with minimal side effects. She was instructed to make Dr. Lindi Adie or myself aware if she begins to experience any worsening side effects of the medication and I could see her back in clinic to help manage those side effects, as needed. Her mammogram is due 10/2019; orders placed today. Today, a comprehensive survivorship care plan and treatment summary was reviewed with the patient today detailing her breast cancer diagnosis, treatment course, potential late/long-term effects of treatment, appropriate follow-up care with recommendations for the future, and patient education resources.  A copy of this summary, along with a letter will be sent to the patient's primary care  provider via mail/fax/In Basket message after today's visit.    2. Short term memory changes: This is likely secondary to stress.  As things settle, if it continues, she will call me and I will refer to Dr. Mickeal Skinner.    3. Bone health:  Given Ariel Johnson's age/history of breast cancer and her current treatment regimen including anti-estrogen therapy with Anastrozole, she is at risk for bone demineralization.  Her last DEXA scan was 03/2019, which showed osteopenia with a t score of -1.9 in the left femur.  In the meantime, she was encouraged to increase her consumption of foods rich in calcium, as well as increase her weight-bearing activities.  She was given education on specific activities to promote bone health.  4. Cancer screening:  Due to Ariel Johnson's history and her age, she should receive screening for skin cancers, colon cancer, and gynecologic cancers.  The information and recommendations are listed on the patient's comprehensive care plan/treatment summary and were reviewed in detail with the patient.    5. Health maintenance and wellness promotion: [name] was encouraged to consume 5-7 servings of fruits and vegetables per day. We reviewed the "Nutrition Rainbow"  handout, as well as the handout "Take Control of Your Health and Reduce Your Cancer Risk" from the [place].  She was also encouraged to engage in moderate to vigorous exercise for 30 minutes per day most days of the week. We discussed the [employer] fitness program, which is designed for cancer survivors to help them become more physically fit after cancer treatments.  She was instructed to limit her alcohol consumption and continue to abstain from tobacco use.     6. Support services/counseling: It is not uncommon for this period of the patient's cancer care trajectory to be one of many emotions and stressors.  We discussed how this can be increasingly difficult during the times of quarantine and social distancing due to the COVID-19 pandemic. She was given information regarding our available services and encouraged to contact me with any questions or for help enrolling in any of our support group/programs.    Follow up instructions:    -Return to cancer center in 82month for f/u with Dr. GLissa Morales -Mammogram due in 10/2019 -Follow up with surgery  -Bone density testing in 03/2021 -She is welcome to return back to the Survivorship Clinic at any time; no additional follow-up needed at this time.  -Consider referral back to survivorship as a long-term survivor for continued surveillance  The patient was provided an opportunity to ask questions and all were answered. The patient agreed with the plan and demonstrated an understanding of the instructions.   Total encounter time: 30 minutes*  LWilber Bihari NP 05/12/19 3:52 PM Medical Oncology and H[facility] [id number]. [phone]   Fax. [phone] *Total Encounter Time as defined by the Centers for Medicare and Medicaid Services includes, in addition to the face-to-face time of a patient visit (documented in the note above) non-face-to-face time: obtaining and  reviewing outside history, ordering and reviewing medications, tests or procedures, care coordination (communications with other health care professionals or caregivers) and documentation in the medical record.

## 2019-05-13 ENCOUNTER — Telehealth: Payer: Self-pay | Admitting: Adult Health

## 2019-05-13 NOTE — Telephone Encounter (Signed)
Scheduled appt per 3/24 los. Was not able to leave a voicemail with appt details as pt's voicemail box was full. Mailed appt reminder and calendar.

## 2019-07-14 DIAGNOSIS — H2513 Age-related nuclear cataract, bilateral: Secondary | ICD-10-CM | POA: Diagnosis not present

## 2019-07-14 DIAGNOSIS — R52 Pain, unspecified: Secondary | ICD-10-CM | POA: Diagnosis not present

## 2019-07-14 DIAGNOSIS — M65311 Trigger thumb, right thumb: Secondary | ICD-10-CM | POA: Diagnosis not present

## 2019-07-14 DIAGNOSIS — M180 Bilateral primary osteoarthritis of first carpometacarpal joints: Secondary | ICD-10-CM | POA: Diagnosis not present

## 2019-07-14 DIAGNOSIS — M65312 Trigger thumb, left thumb: Secondary | ICD-10-CM | POA: Diagnosis not present

## 2019-08-12 DIAGNOSIS — C50911 Malignant neoplasm of unspecified site of right female breast: Secondary | ICD-10-CM | POA: Diagnosis not present

## 2019-08-16 DIAGNOSIS — M65312 Trigger thumb, left thumb: Secondary | ICD-10-CM | POA: Diagnosis not present

## 2019-08-16 DIAGNOSIS — M65311 Trigger thumb, right thumb: Secondary | ICD-10-CM | POA: Diagnosis not present

## 2019-08-20 DIAGNOSIS — M545 Low back pain: Secondary | ICD-10-CM | POA: Diagnosis not present

## 2019-08-20 DIAGNOSIS — M5441 Lumbago with sciatica, right side: Secondary | ICD-10-CM | POA: Diagnosis not present

## 2019-08-24 DIAGNOSIS — M5416 Radiculopathy, lumbar region: Secondary | ICD-10-CM | POA: Diagnosis not present

## 2019-08-25 DIAGNOSIS — M5416 Radiculopathy, lumbar region: Secondary | ICD-10-CM | POA: Diagnosis not present

## 2019-08-26 DIAGNOSIS — M5416 Radiculopathy, lumbar region: Secondary | ICD-10-CM | POA: Diagnosis not present

## 2019-08-26 DIAGNOSIS — M5441 Lumbago with sciatica, right side: Secondary | ICD-10-CM | POA: Diagnosis not present

## 2019-08-30 DIAGNOSIS — M5416 Radiculopathy, lumbar region: Secondary | ICD-10-CM | POA: Diagnosis not present

## 2019-09-07 DIAGNOSIS — M5416 Radiculopathy, lumbar region: Secondary | ICD-10-CM | POA: Diagnosis not present

## 2019-09-08 DIAGNOSIS — M5416 Radiculopathy, lumbar region: Secondary | ICD-10-CM | POA: Diagnosis not present

## 2019-09-10 DIAGNOSIS — M5416 Radiculopathy, lumbar region: Secondary | ICD-10-CM | POA: Diagnosis not present

## 2019-09-16 DIAGNOSIS — M5416 Radiculopathy, lumbar region: Secondary | ICD-10-CM | POA: Diagnosis not present

## 2019-09-17 DIAGNOSIS — M545 Low back pain: Secondary | ICD-10-CM | POA: Diagnosis not present

## 2019-09-20 DIAGNOSIS — M5416 Radiculopathy, lumbar region: Secondary | ICD-10-CM | POA: Diagnosis not present

## 2019-09-23 DIAGNOSIS — M5441 Lumbago with sciatica, right side: Secondary | ICD-10-CM | POA: Diagnosis not present

## 2019-09-23 DIAGNOSIS — M545 Low back pain: Secondary | ICD-10-CM | POA: Diagnosis not present

## 2019-09-24 DIAGNOSIS — M5416 Radiculopathy, lumbar region: Secondary | ICD-10-CM | POA: Diagnosis not present

## 2019-10-05 DIAGNOSIS — M5416 Radiculopathy, lumbar region: Secondary | ICD-10-CM | POA: Diagnosis not present

## 2019-10-06 DIAGNOSIS — Z1159 Encounter for screening for other viral diseases: Secondary | ICD-10-CM | POA: Diagnosis not present

## 2019-10-14 DIAGNOSIS — M5416 Radiculopathy, lumbar region: Secondary | ICD-10-CM | POA: Diagnosis not present

## 2019-10-19 DIAGNOSIS — M5416 Radiculopathy, lumbar region: Secondary | ICD-10-CM | POA: Diagnosis not present

## 2019-10-22 DIAGNOSIS — L218 Other seborrheic dermatitis: Secondary | ICD-10-CM | POA: Diagnosis not present

## 2019-10-22 DIAGNOSIS — C44529 Squamous cell carcinoma of skin of other part of trunk: Secondary | ICD-10-CM | POA: Diagnosis not present

## 2019-10-22 DIAGNOSIS — B078 Other viral warts: Secondary | ICD-10-CM | POA: Diagnosis not present

## 2019-10-22 DIAGNOSIS — D485 Neoplasm of uncertain behavior of skin: Secondary | ICD-10-CM | POA: Diagnosis not present

## 2019-11-08 ENCOUNTER — Other Ambulatory Visit: Payer: Self-pay

## 2019-11-08 ENCOUNTER — Ambulatory Visit
Admission: RE | Admit: 2019-11-08 | Discharge: 2019-11-08 | Disposition: A | Discharge status: Home / Self Care | Payer: PPO | Source: Ambulatory Visit | Attending: Adult Health | Admitting: Adult Health

## 2019-11-08 DIAGNOSIS — C50211 Malignant neoplasm of upper-inner quadrant of right female breast: Secondary | ICD-10-CM

## 2019-11-08 DIAGNOSIS — R922 Inconclusive mammogram: Secondary | ICD-10-CM | POA: Diagnosis not present

## 2019-11-08 DIAGNOSIS — M5416 Radiculopathy, lumbar region: Secondary | ICD-10-CM | POA: Diagnosis not present

## 2019-11-08 DIAGNOSIS — Z853 Personal history of malignant neoplasm of breast: Secondary | ICD-10-CM | POA: Diagnosis not present

## 2019-11-11 NOTE — Progress Notes (Signed)
Patient Care Team: Lavone Orn, MD as PCP - General (Internal Medicine) Alphonsa Overall, MD as Consulting Physician (General Surgery) Nicholas Lose, MD as Consulting Physician (Hematology and Oncology) Eppie Gibson, MD as Attending Physician (Radiation Oncology)  DIAGNOSIS:    ICD-10-CM   1. Malignant neoplasm of upper-inner quadrant of right breast in female, estrogen receptor positive ([place])  C50.211    Z17.0     SUMMARY OF ONCOLOGIC HISTORY: Oncology History  Malignant neoplasm of upper-inner quadrant of right breast in female, estrogen receptor positive ([place])  11/13/2018 Initial Diagnosis   Routine screening mammogram detected a right breast mass, 2.6cm, at the 1 o'clock position. Biopsy showed IDC with DCIS, grade 2, HER-2 - (1+), ER+ 95%, PR+ 95%, Ki67 15%.   11/18/2018 Cancer Staging   Staging form: Breast, AJCC 8th Edition - Clinical stage from 11/18/2018: Stage IB (cT2, cN0, cM0, G2, ER+, PR+, HER2-)    11/25/2018 Genetic Testing   Negative genetic testing on the [facility] Breast Cancer STAT panel and the Common Hereditary Cancers panel. The report date is 11/25/2018.  The STAT Breast cancer panel offered by [facility] includes sequencing and rearrangement analysis for the following 9 genes:  ATM, BRCA1, BRCA2, CDH1, CHEK2, PALB2, PTEN, STK11 and TP53.  The Common Hereditary Cancers Panel offered by [facility] includes sequencing and/or deletion duplication testing of the following 48 genes: APC, ATM, AXIN2, BARD1, BMPR1A, BRCA1, BRCA2, BRIP1, CDH1, CDK4, CDKN2A (p14ARF), CDKN2A (p16INK4a), CHEK2, CTNNA1, DICER1, EPCAM (Deletion/duplication testing only), GREM1 (promoter region deletion/duplication testing only), KIT, MEN1, MLH1, MSH2, MSH3, MSH6, MUTYH, NBN, NF1, NHTL1, PALB2, PDGFRA, PMS2, POLD1, POLE, PTEN, RAD50, RAD51C, RAD51D, RNF43, SDHB, SDHC, SDHD, SMAD4, SMARCA4. STK11, TP53, TSC1, TSC2, and VHL.  The following genes were evaluated for sequence changes only: SDHA and HOXB13  c.251G>A variant only.    12/24/2018 Surgery   Right lumpectomy Lucia Gaskins) [phone]): IDC with DCIS, 3.1cm, grade 2, clear margins, 3 right axillary lymph nodes negative.  ER 95%, PR 95%, Ki-67 15%, HER-2 negative   12/24/2018 Oncotype testing   Oncotype: score 10, 3% chance of distant recurrence in 9 years with tamoxifen alone.    12/31/2018 Cancer Staging   Staging form: Breast, AJCC 8th Edition - Pathologic stage from 12/31/2018: Stage IA (pT2, pN0, cM0, G2, ER+, PR+, HER2-)    01/22/2019 - 02/18/2019 Radiation Therapy   The patient initially received a dose of 40.05 Gy in 15 fractions to the breast using whole-breast tangent fields. This was delivered using a 3-D conformal technique. The pt received a boost delivering an additional 10 Gy in 5 fractions using a electron boost with 24mV electrons. The total dose was 50.05 Gy.    02/2019 - 02/2024 Anti-estrogen oral therapy   Anastrozole     CHIEF COMPLIANT: Follow-up of right breast cancer on anastrozole  INTERVAL HISTORY: CDOMINIGUE GELLNERis a 69y.o. with above-mentioned history of right breast cancer who underwent a lumpectomy, radiation, and is currently on antiestrogen therapy with anastrozole. Mammogram on 11/08/19 showed no evidence of malignancy in the right breast. She presents to the clinic today for follow-up.  She is tolerating anastrozole extremely well without any major problems.  She does have occasional hot flashes.  Muscle aches and pains are not a significant problem.  She was recently diagnosed with squamous cell cancer of the skin and is planning to undergo surgery November.  ALLERGIES:  is allergic to sulfamethoxazole and sulfur.  MEDICATIONS:  Current Outpatient Medications  Medication Sig Dispense Refill  . anastrozole (  ARIMIDEX) 1 MG tablet Take 1 tablet (1 mg total) by mouth daily. 90 tablet 3  . Calcium Carbonate-Vit D-Min (CALTRATE 600+D PLUS PO) Take by mouth daily.    Marland Kitchen DOCUSATE SODIUM PO Take 50 mg  by mouth as needed.    . Magnesium 250 MG TABS Take by mouth as needed.    . Multiple Vitamin (MULTIVITAMIN) tablet Take 1 tablet by mouth daily.    Marland Kitchen omeprazole (PRILOSEC) 20 MG capsule Take 20 mg by mouth daily.    . Probiotic Product (PROBIOTIC DAILY PO) Take by mouth.    . Turmeric (QC TUMERIC COMPLEX PO) Take 1,400 mg by mouth daily.    Marland Kitchen UNABLE TO FIND Goli Apple Cider Gummies   For Acid Reflux    . XARELTO 20 MG TABS tablet TAKE 1 TABLET BY MOUTH EVERY DAY WITH SUPPER 30 tablet 1   No current facility-administered medications for this visit.    PHYSICAL EXAMINATION: ECOG PERFORMANCE STATUS: 1 - Symptomatic but completely ambulatory  Vitals:   11/12/19 1139  BP: 123/77  Pulse: 82  Resp: 18  Temp: 97.8 F (36.6 C)  SpO2: 98%   Filed Weights   11/12/19 1139  Weight: 151 lb 1.6 oz (68.5 kg)    BREAST: No palpable masses or nodules in either right or left breasts. No palpable axillary supraclavicular or infraclavicular adenopathy no breast tenderness or nipple discharge. (exam performed in the presence of a chaperone)  LABORATORY DATA:  I have reviewed the data as listed CMP Latest Ref Rng & Units 11/18/2018  Glucose 70 - 99 mg/dL 60(L)  BUN 8 - 23 mg/dL 15  Creatinine 0.44 - 1.00 mg/dL 0.84  Sodium 135 - 145 mmol/L 142  Potassium 3.5 - 5.1 mmol/L 3.6  Chloride 98 - 111 mmol/L 105  CO2 22 - 32 mmol/L 27  Calcium 8.9 - 10.3 mg/dL 9.3  Total Protein 6.5 - 8.1 g/dL 7.0  Total Bilirubin 0.3 - 1.2 mg/dL 0.7  Alkaline Phos 38 - 126 U/L 87  AST 15 - 41 U/L 21  ALT 0 - 44 U/L 18    Lab Results  Component Value Date   WBC 5.4 11/18/2018   HGB 12.9 11/18/2018   HCT 39.7 11/18/2018   MCV 92.1 11/18/2018   PLT 165 11/18/2018   NEUTROABS 3.0 11/18/2018    ASSESSMENT & PLAN:  Malignant neoplasm of upper-inner quadrant of right breast in female, estrogen receptor positive ([place]) 11/13/2018:Routine screening mammogram detected a right breast mass, 2.6cm, at the 1 o'clock  position. Biopsy showed IDC with DCIS, grade 2, HER-2 - (1+), ER+ 95%, PR+ 95%, Ki67 15%. T2N0 stage Ib Prior history of left mastectomy 1998 Treatment plan: 1. 12/24/2018:Right lumpectomy Lucia Gaskins): IDC with DCIS, 3.1cm, grade 2, clear margins, 3 right axillary lymph nodes negative. T2 N0 stage Ib 2. Oncotype: score 10, 3% chance of distant recurrence in 9 years with tamoxifen alone.  3. Adjuvant radiation therapy12/05/2018 4. Adjuvant antiestrogen therapywith anastrozole 1 mg daily x5 to 7 years --------------------------------------------------------------------------------------------------------------------------- Treatment plan: Adjuvant antiestrogen therapy with anastrozole started January 15th 2021 Anastrozole toxicities: Denies any major side effects.  She does have occasional hot flashes which are manageable. Occasional muscle cramps but no muscle stiffness or achiness. She had a pinched nerve in the back which she received an injection and is doing much better. Squamous cell skin cancer diagnosis: She is getting surgery November.  Breast cancer surveillance: 1.  Mammogram 11/08/2019: Benign breast density category C 2. breast exam March  2021  Return to clinic in 6 months for follow-up and after that we can see her once a year    No orders of the defined types were placed in this encounter.  The patient has a good understanding of the overall plan. she agrees with it. she will call with any problems that may develop before the next visit here.  Total time spent: 20 mins including face to face time and time spent for planning, charting and coordination of care  Nicholas Lose, MD 11/12/2019  I, Cloyde Reams Dorshimer, am acting as scribe for Dr. Nicholas Lose.  I have reviewed the above documentation for accuracy and completeness, and I agree with the above.

## 2019-11-12 ENCOUNTER — Other Ambulatory Visit: Payer: Self-pay

## 2019-11-12 ENCOUNTER — Inpatient Hospital Stay: Payer: PPO | Attending: Hematology and Oncology | Admitting: Hematology and Oncology

## 2019-11-12 DIAGNOSIS — Z7901 Long term (current) use of anticoagulants: Secondary | ICD-10-CM | POA: Diagnosis not present

## 2019-11-12 DIAGNOSIS — Z923 Personal history of irradiation: Secondary | ICD-10-CM | POA: Insufficient documentation

## 2019-11-12 DIAGNOSIS — Z79899 Other long term (current) drug therapy: Secondary | ICD-10-CM | POA: Insufficient documentation

## 2019-11-12 DIAGNOSIS — Z79811 Long term (current) use of aromatase inhibitors: Secondary | ICD-10-CM | POA: Diagnosis not present

## 2019-11-12 DIAGNOSIS — Z9012 Acquired absence of left breast and nipple: Secondary | ICD-10-CM | POA: Diagnosis not present

## 2019-11-12 DIAGNOSIS — Z170 Estrogen receptor positive status [ER+]: Secondary | ICD-10-CM | POA: Insufficient documentation

## 2019-11-12 DIAGNOSIS — C50211 Malignant neoplasm of upper-inner quadrant of right female breast: Secondary | ICD-10-CM | POA: Insufficient documentation

## 2019-11-12 MED ORDER — OMEPRAZOLE 20 MG PO CPDR: 20.0000 mg | DELAYED_RELEASE_CAPSULE | Freq: Every day | ORAL | Status: DC | PRN

## 2019-11-12 NOTE — Assessment & Plan Note (Signed)
11/13/2018:Routine screening mammogram detected a right breast mass, 2.6cm, at the 1 o'clock position. Biopsy showed IDC with DCIS, grade 2, HER-2 - (1+), ER+ 95%, PR+ 95%, Ki67 15%. T2N0 stage Ib Prior history of left mastectomy 1998 Treatment plan: 1. 12/24/2018:Right lumpectomy Ariel Johnson): IDC with DCIS, 3.1cm, grade 2, clear margins, 3 right axillary lymph nodes negative. T2 N0 stage Ib 2. Oncotype: score 10, 3% chance of distant recurrence in 9 years with tamoxifen alone.  3. Adjuvant radiation therapy12/05/2018 4. Adjuvant antiestrogen therapywith anastrozole 1 mg daily x5 to 7 years --------------------------------------------------------------------------------------------------------------------------- Treatment plan: Adjuvant antiestrogen therapy with anastrozole to begin January 15th 2021 Anastrozole toxicities:  DVT left leg:  On Xarelto. Breast cancer surveillance: 1.  Mammogram 11/08/2019: Benign breast density category C 2. breast exam March 2021  Return to clinic in 6 months for follow-up and after that we can see her once a year

## 2019-11-17 DIAGNOSIS — Z23 Encounter for immunization: Secondary | ICD-10-CM | POA: Diagnosis not present

## 2019-12-09 DIAGNOSIS — C44529 Squamous cell carcinoma of skin of other part of trunk: Secondary | ICD-10-CM | POA: Diagnosis not present

## 2019-12-09 DIAGNOSIS — L309 Dermatitis, unspecified: Secondary | ICD-10-CM | POA: Diagnosis not present

## 2020-01-24 DIAGNOSIS — R0789 Other chest pain: Secondary | ICD-10-CM | POA: Diagnosis not present

## 2020-01-31 DIAGNOSIS — D2272 Melanocytic nevi of left lower limb, including hip: Secondary | ICD-10-CM | POA: Diagnosis not present

## 2020-01-31 DIAGNOSIS — L821 Other seborrheic keratosis: Secondary | ICD-10-CM | POA: Diagnosis not present

## 2020-01-31 DIAGNOSIS — L918 Other hypertrophic disorders of the skin: Secondary | ICD-10-CM | POA: Diagnosis not present

## 2020-01-31 DIAGNOSIS — Z85828 Personal history of other malignant neoplasm of skin: Secondary | ICD-10-CM | POA: Diagnosis not present

## 2020-01-31 DIAGNOSIS — L814 Other melanin hyperpigmentation: Secondary | ICD-10-CM | POA: Diagnosis not present

## 2020-01-31 DIAGNOSIS — D225 Melanocytic nevi of trunk: Secondary | ICD-10-CM | POA: Diagnosis not present

## 2020-02-12 ENCOUNTER — Other Ambulatory Visit: Payer: Self-pay | Admitting: Hematology and Oncology

## 2020-03-15 DIAGNOSIS — H43812 Vitreous degeneration, left eye: Secondary | ICD-10-CM | POA: Diagnosis not present

## 2020-03-15 DIAGNOSIS — H35433 Paving stone degeneration of retina, bilateral: Secondary | ICD-10-CM | POA: Diagnosis not present

## 2020-03-15 DIAGNOSIS — H2513 Age-related nuclear cataract, bilateral: Secondary | ICD-10-CM | POA: Diagnosis not present

## 2020-03-15 DIAGNOSIS — H04123 Dry eye syndrome of bilateral lacrimal glands: Secondary | ICD-10-CM | POA: Diagnosis not present

## 2020-03-15 DIAGNOSIS — H4312 Vitreous hemorrhage, left eye: Secondary | ICD-10-CM | POA: Diagnosis not present

## 2020-03-15 DIAGNOSIS — H43392 Other vitreous opacities, left eye: Secondary | ICD-10-CM | POA: Diagnosis not present

## 2020-03-19 IMAGING — MG US  BREAST BX W/ LOC DEV 1ST LESION IMG BX SPEC US GUIDE*R*
1 series · 8 of 8 positions shown · non-contrast
Comparison: Previous exam(s).
COMPARISON: Previous exam(s).

Addendum:
CLINICAL DATA: Patient returns for ultrasound-guided core biopsy of
RIGHT breast mass. History of LEFT mastectomy.

EXAM:
ULTRASOUND GUIDED RIGHT BREAST CORE NEEDLE BIOPSY

[Series 1: MG view · 0.06mm/px · 8 of 9 slices shown]
[im 1/9]
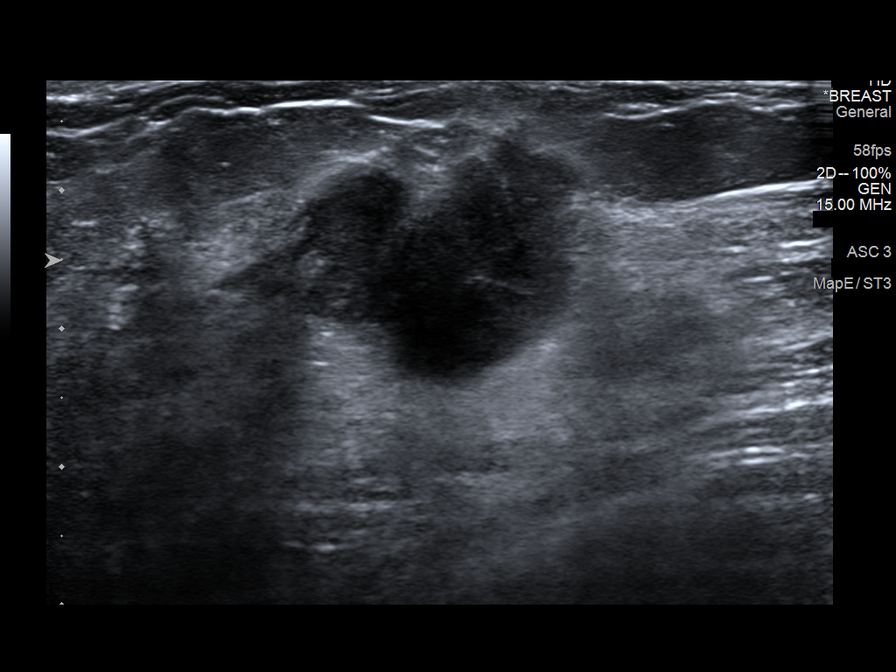
[im 2/9]
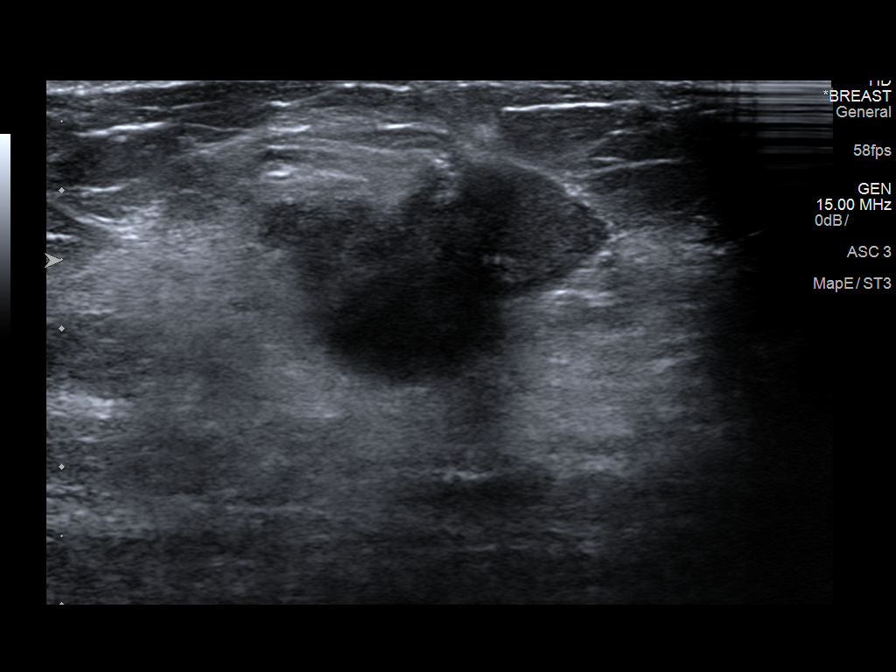
[im 3/9]
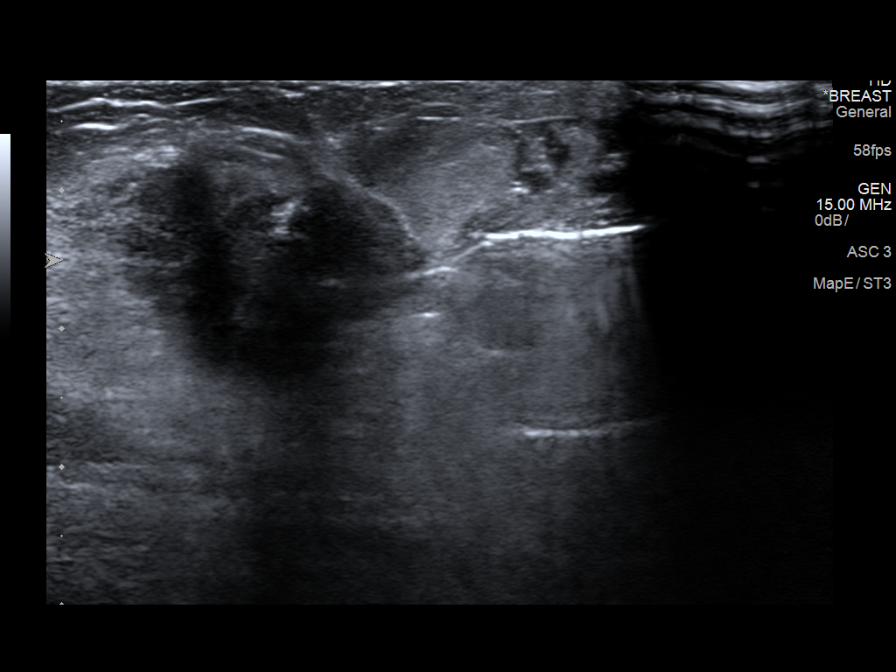
[im 4/9]
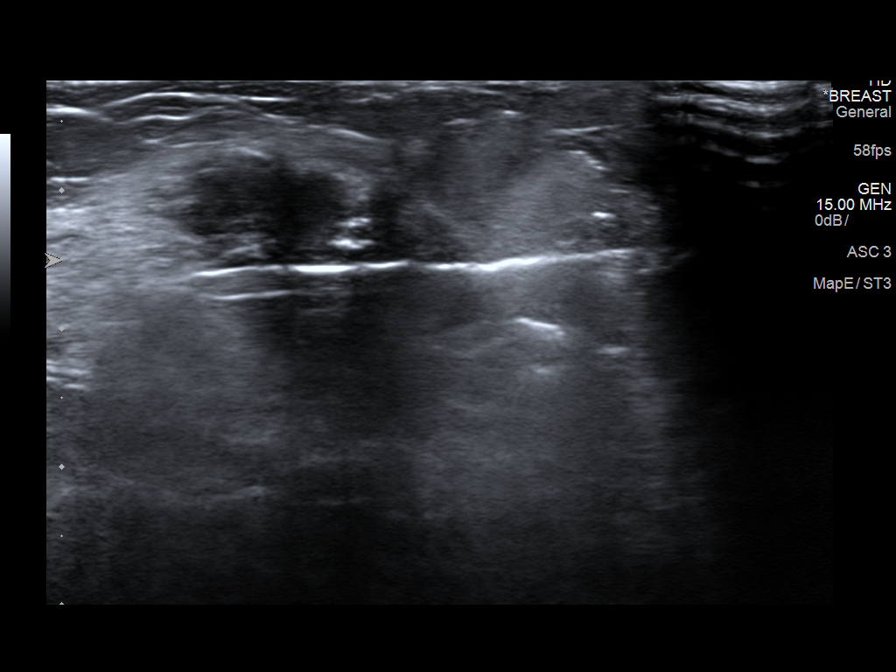
[im 5/9]
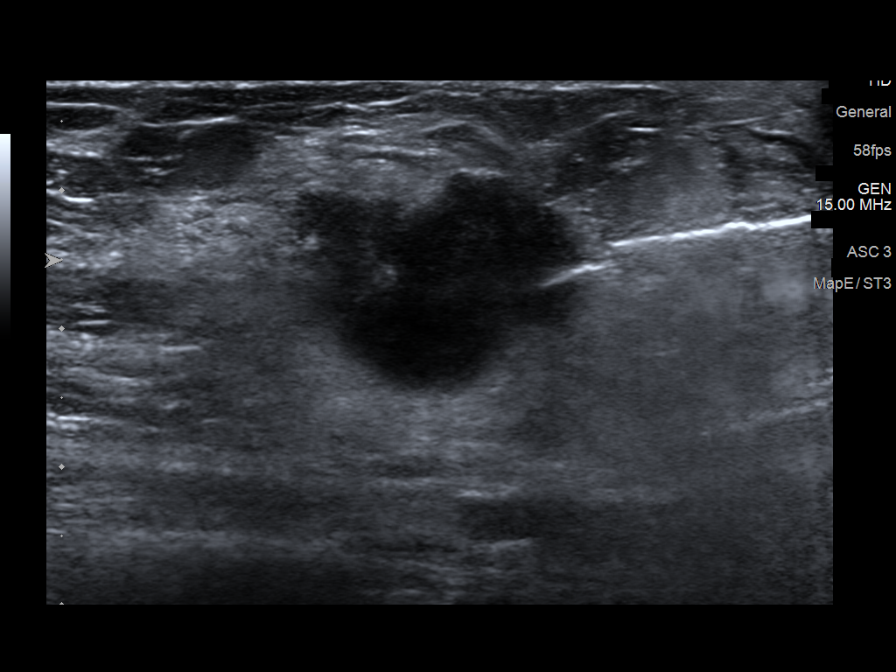
[im 6/9]
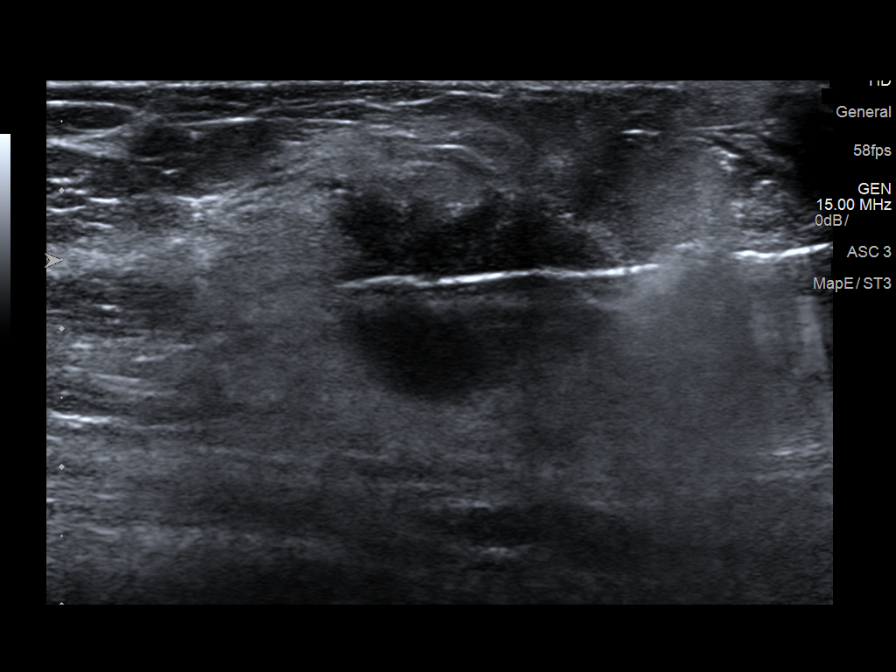
[im 7/9]
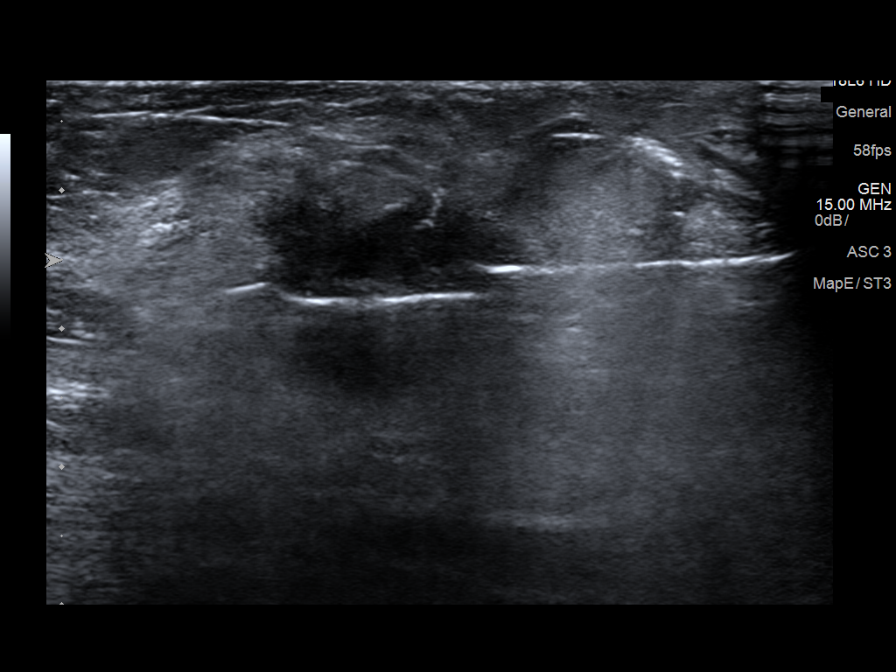
[im 9/9]
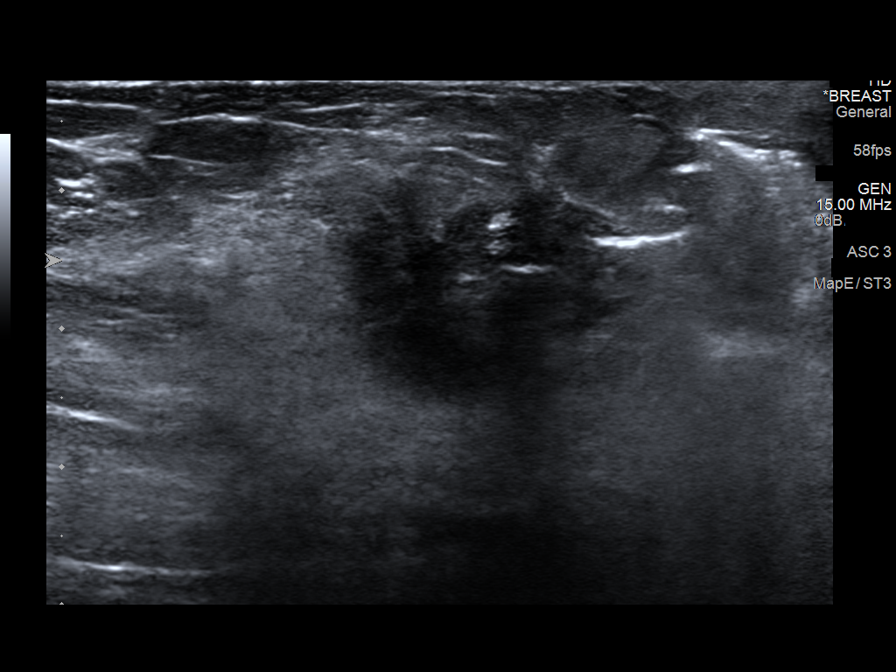

[8 of 8 positions shown; findings below may reference images not displayed]



Lesion quadrant: UPPER INNER QUADRANT RIGHT breast

Using sterile technique and 1% Lidocaine as local anesthetic, under
direct ultrasound visualization, a 12 gauge Ssemisun device was
used to perform biopsy of mass in the 1 o'clock location of the
RIGHT breast using a inferior to superior approach. At the
conclusion of the procedure a ribbon shaped tissue marker clip was
deployed into the biopsy cavity. Follow up 2 view mammogram was
performed and dictated separately.
IMPRESSION: Ultrasound guided biopsy of RIGHT breast mass. No apparent
complications.

ADDENDUM:
PATHOLOGY ADDENDUM:

Pathology RIGHT breast 1 o'clock: Invasive ductal carcinoma and
ductal carcinoma in situ, grade II.

Pathology concordance with imaging findings: Yes

Recommendation: Consultation for treatment plan. Patient will be
evaluated at the [REDACTED] 11/18/2018.

The patient is strongly considering RIGHT mastectomy and possible
breast reconstruction. History of LEFT mastectomy and tram
reconstruction.

At the request of the patient, I spoke with her by telephone on
11/12/2018 at [DATE]. She reports doing well after the biopsy .



Lesion quadrant: UPPER INNER QUADRANT RIGHT breast

Using sterile technique and 1% Lidocaine as local anesthetic, under
direct ultrasound visualization, a 12 gauge Ssemisun device was
used to perform biopsy of mass in the 1 o'clock location of the
RIGHT breast using a inferior to superior approach. At the
conclusion of the procedure a ribbon shaped tissue marker clip was
deployed into the biopsy cavity. Follow up 2 view mammogram was
performed and dictated separately.
IMPRESSION: Ultrasound guided biopsy of RIGHT breast mass. No apparent
complications.

## 2020-03-23 DIAGNOSIS — H2513 Age-related nuclear cataract, bilateral: Secondary | ICD-10-CM | POA: Diagnosis not present

## 2020-03-23 DIAGNOSIS — H43812 Vitreous degeneration, left eye: Secondary | ICD-10-CM | POA: Diagnosis not present

## 2020-03-31 DIAGNOSIS — Z1389 Encounter for screening for other disorder: Secondary | ICD-10-CM | POA: Diagnosis not present

## 2020-03-31 DIAGNOSIS — Z853 Personal history of malignant neoplasm of breast: Secondary | ICD-10-CM | POA: Diagnosis not present

## 2020-03-31 DIAGNOSIS — Z23 Encounter for immunization: Secondary | ICD-10-CM | POA: Diagnosis not present

## 2020-03-31 DIAGNOSIS — M8588 Other specified disorders of bone density and structure, other site: Secondary | ICD-10-CM | POA: Diagnosis not present

## 2020-03-31 DIAGNOSIS — Z0000 Encounter for general adult medical examination without abnormal findings: Secondary | ICD-10-CM | POA: Diagnosis not present

## 2020-05-01 IMAGING — MG MM PLC BREAST LOC DEV 1ST LESION INC*R*
7 series · 7 of 7 positions shown · non-contrast
Comparison: Previous exam(s).

CLINICAL DATA: Biopsy-proven RIGHT breast cancer scheduled for
lumpectomy today requiring preoperative radioactive seed
localization.

EXAM:
MAMMOGRAPHIC GUIDED RADIOACTIVE SEED LOCALIZATION OF THE RIGHT
BREAST

[R ML (1 of 3)]
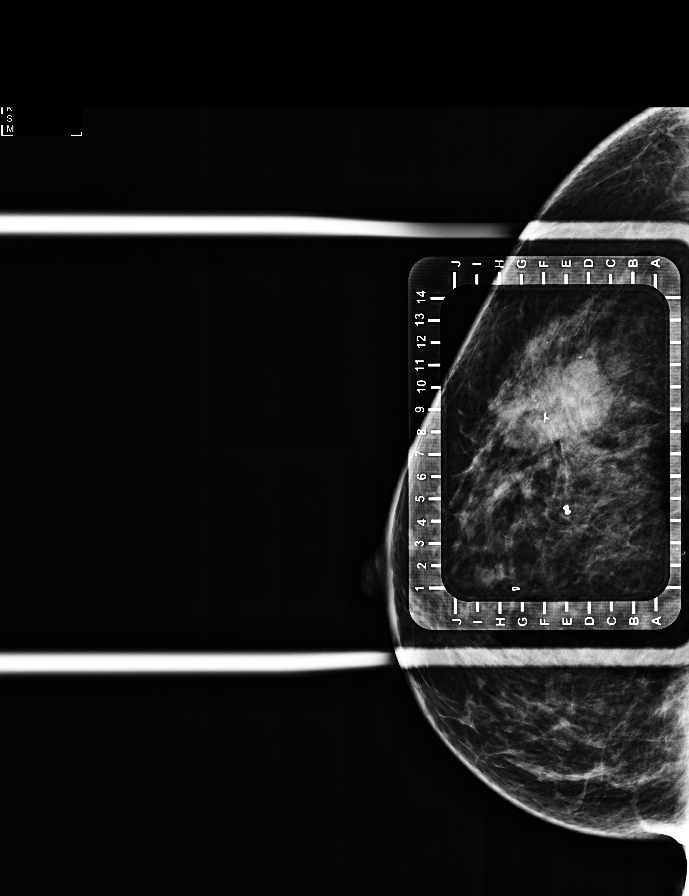

[R ML (2 of 3)]
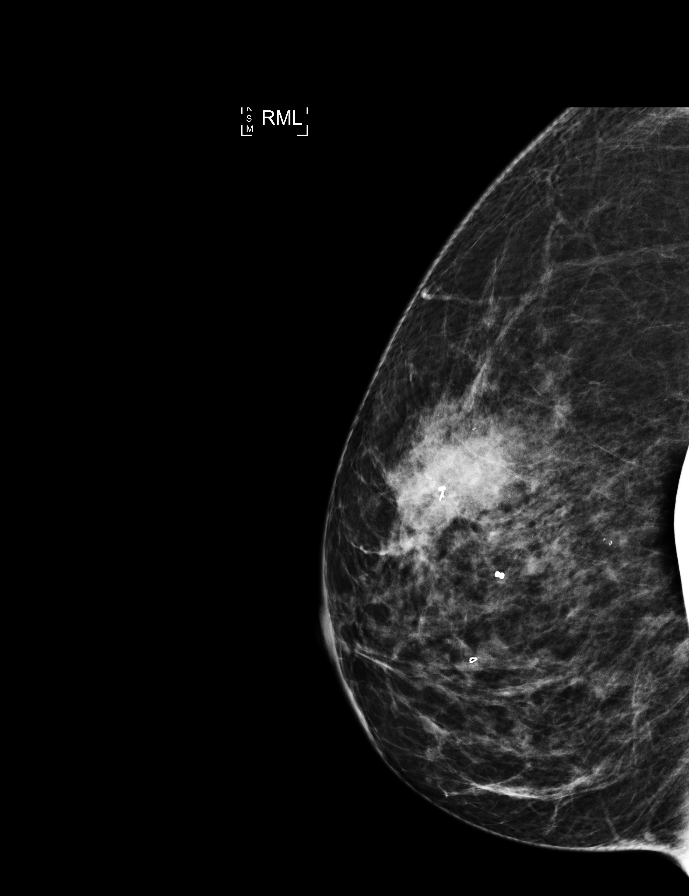

[R CC (1 of 4)]
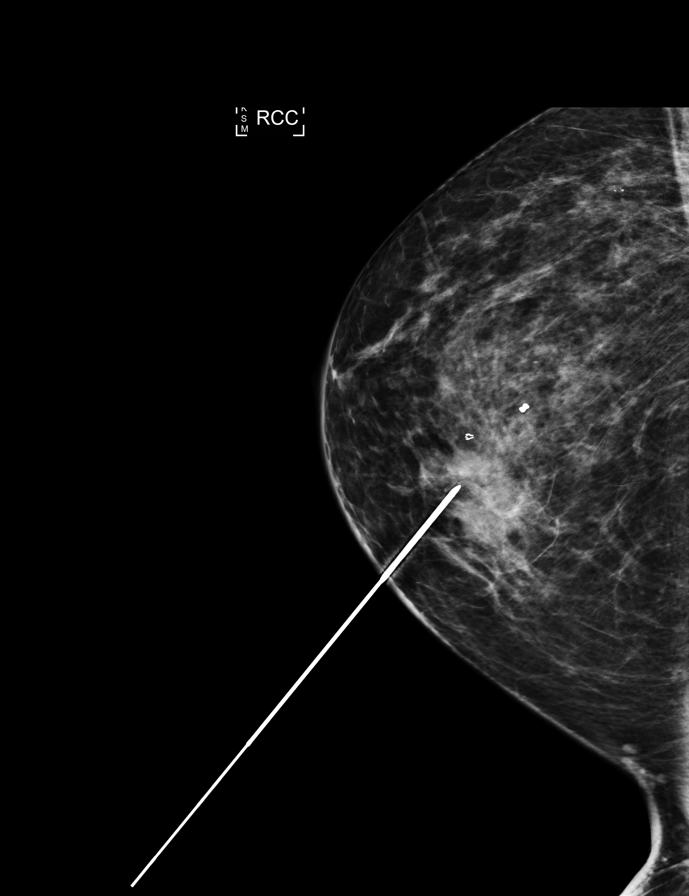

[R ML (3 of 3)]
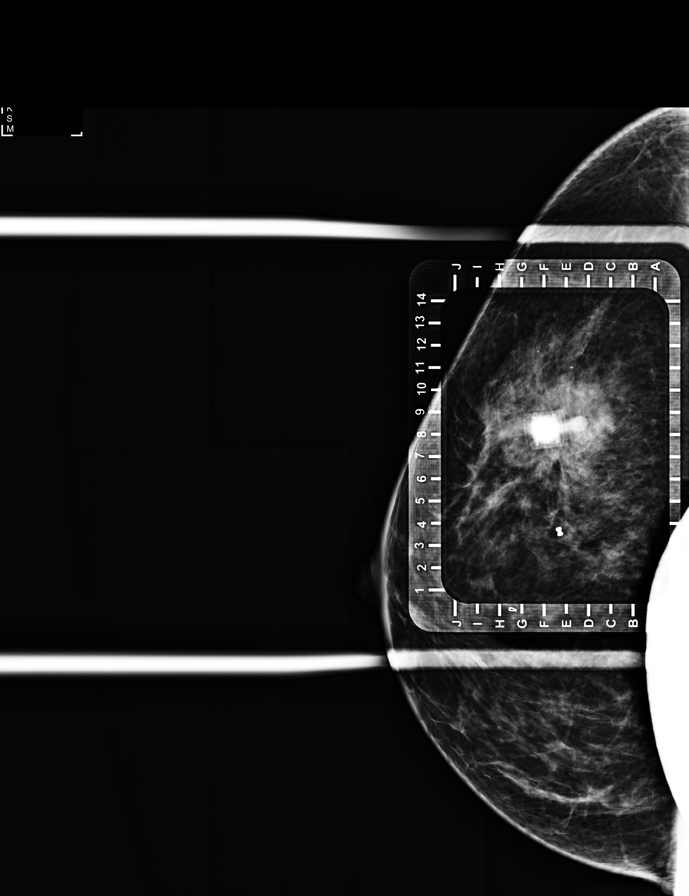

[R CC (2 of 4)]
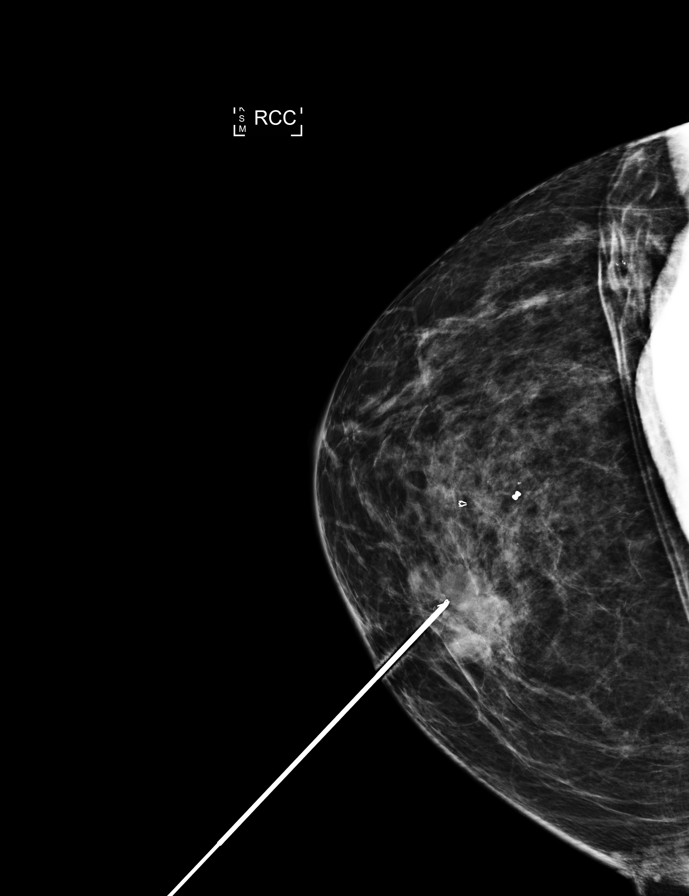

[R CC (3 of 4)]
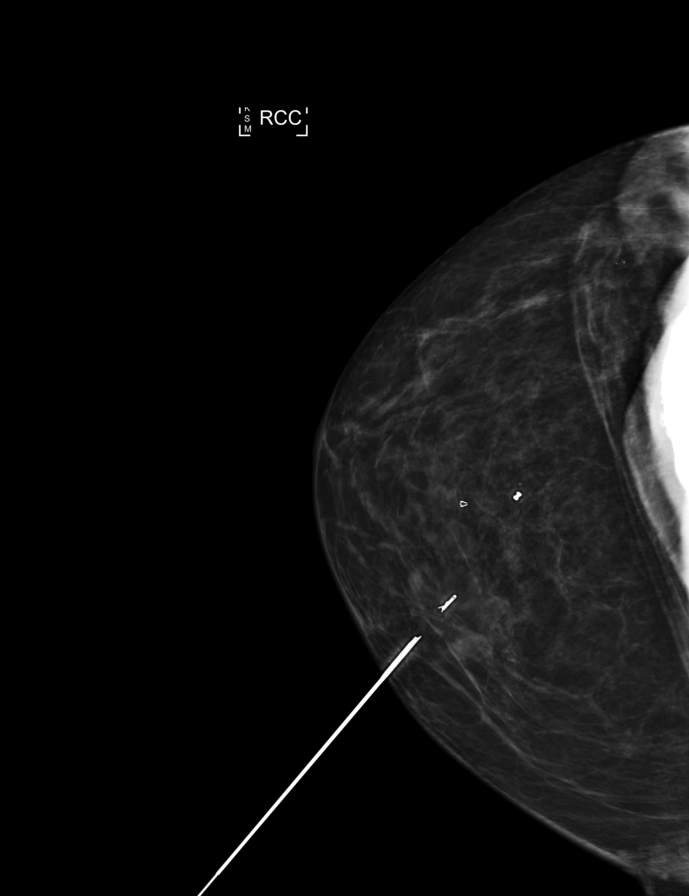

[R CC (4 of 4)]
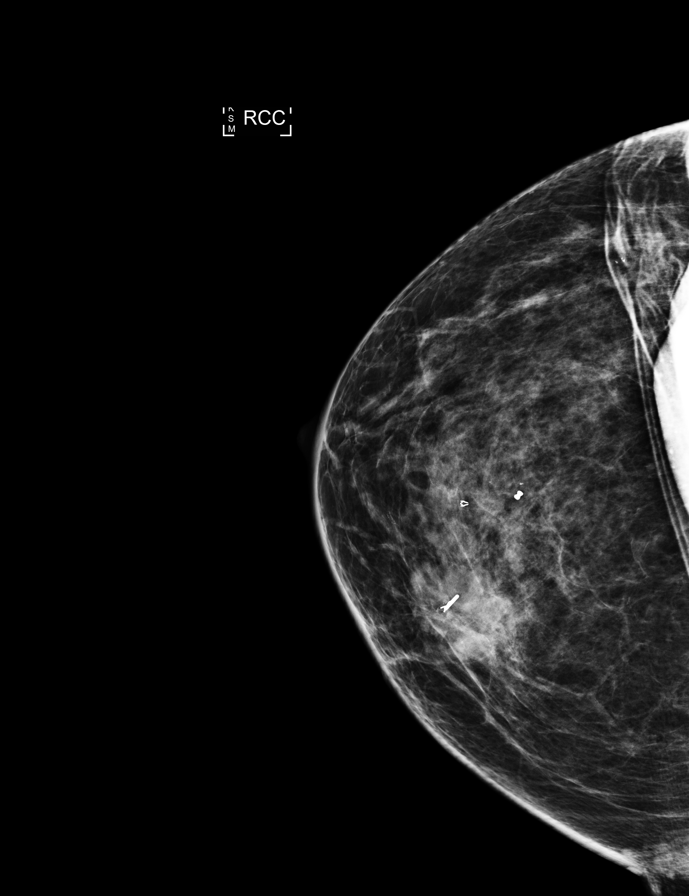

[7 of 7 positions shown; findings below may reference images not displayed]

FINDINGS: Patient presents for radioactive seed localization prior to breast
conservation surgery. I met with the patient and we discussed the
procedure of seed localization including benefits and alternatives.
We discussed the high likelihood of a successful procedure. We
discussed the risks of the procedure including infection, bleeding,
tissue injury and further surgery. We discussed the low dose of
radioactivity involved in the procedure. Informed, written consent
was given.

The usual time-out protocol was performed immediately prior to the
procedure.

Using mammographic guidance, sterile technique, 1% lidocaine and an
G-[id number] radioactive seed, the ribbon shaped clip within the outer
RIGHT breast was localized using a medial approach. The follow-up
mammogram images confirm the seed in the expected location and were
marked for Dr. Tanu.

Follow-up survey of the patient confirms presence of the radioactive
seed.

Order number of G-C45 seed:  [id number].

Total activity:  0.244 millicuries reference Date: 12/14/2018

The patient tolerated the procedure well and was released from the
[REDACTED]. She was given instructions regarding seed removal.
IMPRESSION: Radioactive seed localization right breast. No apparent
complications.

## 2020-05-10 NOTE — Progress Notes (Signed)
Patient Care Team: Lavone Orn, MD as PCP - General (Internal Medicine) Alphonsa Overall, MD as Consulting Physician (General Surgery) Nicholas Lose, MD as Consulting Physician (Hematology and Oncology) Eppie Gibson, MD as Attending Physician (Radiation Oncology)  DIAGNOSIS:    ICD-10-CM   1. Malignant neoplasm of upper-inner quadrant of right breast in female, estrogen receptor positive ([place])  C50.211    Z17.0     SUMMARY OF ONCOLOGIC HISTORY: Oncology History  Malignant neoplasm of upper-inner quadrant of right breast in female, estrogen receptor positive ([place])  11/13/2018 Initial Diagnosis   Routine screening mammogram detected a right breast mass, 2.6cm, at the 1 o'clock position. Biopsy showed IDC with DCIS, grade 2, HER-2 - (1+), ER+ 95%, PR+ 95%, Ki67 15%.   11/18/2018 Cancer Staging   Staging form: Breast, AJCC 8th Edition - Clinical stage from 11/18/2018: Stage IB (cT2, cN0, cM0, G2, ER+, PR+, HER2-)    11/25/2018 Genetic Testing   Negative genetic testing on the [facility] Breast Cancer STAT panel and the Common Hereditary Cancers panel. The report date is 11/25/2018.  The STAT Breast cancer panel offered by [facility] includes sequencing and rearrangement analysis for the following 9 genes:  ATM, BRCA1, BRCA2, CDH1, CHEK2, PALB2, PTEN, STK11 and TP53.  The Common Hereditary Cancers Panel offered by [facility] includes sequencing and/or deletion duplication testing of the following 48 genes: APC, ATM, AXIN2, BARD1, BMPR1A, BRCA1, BRCA2, BRIP1, CDH1, CDK4, CDKN2A (p14ARF), CDKN2A (p16INK4a), CHEK2, CTNNA1, DICER1, EPCAM (Deletion/duplication testing only), GREM1 (promoter region deletion/duplication testing only), KIT, MEN1, MLH1, MSH2, MSH3, MSH6, MUTYH, NBN, NF1, NHTL1, PALB2, PDGFRA, PMS2, POLD1, POLE, PTEN, RAD50, RAD51C, RAD51D, RNF43, SDHB, SDHC, SDHD, SMAD4, SMARCA4. STK11, TP53, TSC1, TSC2, and VHL.  The following genes were evaluated for sequence changes only: SDHA and HOXB13  c.251G>A variant only.    12/24/2018 Surgery   Right lumpectomy Lucia Gaskins) [phone]): IDC with DCIS, 3.1cm, grade 2, clear margins, 3 right axillary lymph nodes negative.  ER 95%, PR 95%, Ki-67 15%, HER-2 negative   12/24/2018 Oncotype testing   Oncotype: score 10, 3% chance of distant recurrence in 9 years with tamoxifen alone.    12/31/2018 Cancer Staging   Staging form: Breast, AJCC 8th Edition - Pathologic stage from 12/31/2018: Stage IA (pT2, pN0, cM0, G2, ER+, PR+, HER2-)    01/22/2019 - 02/18/2019 Radiation Therapy   The patient initially received a dose of 40.05 Gy in 15 fractions to the breast using whole-breast tangent fields. This was delivered using a 3-D conformal technique. The pt received a boost delivering an additional 10 Gy in 5 fractions using a electron boost with 43mV electrons. The total dose was 50.05 Gy.    02/2019 - 02/2024 Anti-estrogen oral therapy   Anastrozole     CHIEF COMPLIANT: Follow-up of right breast cancer on anastrozole  INTERVAL HISTORY: Ariel SCHWARZis a 70y.o. with above-mentioned history of right breast cancer who underwent a lumpectomy, radiation, and is currently on antiestrogen therapy with anastrozole. She presents to the clinic todayfor follow-up.  She has joint stiffness and achiness as well as hot flashes which are quite profound at night.  She developed trigger thumbs and she underwent injections to her thumbs and has had improvement in them.  She also had injection to the back and underwent physical therapy for back pain.  She denies any lumps or nodules in the breast.  ALLERGIES:  is allergic to elemental sulfur and sulfamethoxazole.  MEDICATIONS:  Current Outpatient Medications  Medication Sig Dispense Refill  .  anastrozole (ARIMIDEX) 1 MG tablet TAKE 1 TABLET BY MOUTH EVERY DAY 90 tablet 3  . Calcium Carbonate-Vit D-Min (CALTRATE 600+D PLUS PO) Take by mouth daily.    Marland Kitchen DOCUSATE SODIUM PO Take 50 mg by mouth as needed.     . Magnesium 250 MG TABS Take by mouth as needed.    . Multiple Vitamin (MULTIVITAMIN) tablet Take 1 tablet by mouth daily.    . Probiotic Product (PROBIOTIC DAILY PO) Take by mouth.    . Turmeric (QC TUMERIC COMPLEX PO) Take 1,400 mg by mouth daily.    Marland Kitchen UNABLE TO FIND Goli Apple Cider Gummies   For Acid Reflux     No current facility-administered medications for this visit.    PHYSICAL EXAMINATION: ECOG PERFORMANCE STATUS: 1 - Symptomatic but completely ambulatory  Vitals:   05/11/20 0939  BP: 130/63  Pulse: 87  Resp: 20  Temp: 98.1 F (36.7 C)  SpO2: 97%   Filed Weights   05/11/20 0939  Weight: 150 lb 14.4 oz (68.4 kg)    BREAST: No palpable masses or nodules.  Left breast reconstructed without any palpable lumps or nodules.. (exam performed in the presence of a chaperone)  LABORATORY DATA:  I have reviewed the data as listed CMP Latest Ref Rng & Units 11/18/2018  Glucose 70 - 99 mg/dL 60(L)  BUN 8 - 23 mg/dL 15  Creatinine 0.44 - 1.00 mg/dL 0.84  Sodium 135 - 145 mmol/L 142  Potassium 3.5 - 5.1 mmol/L 3.6  Chloride 98 - 111 mmol/L 105  CO2 22 - 32 mmol/L 27  Calcium 8.9 - 10.3 mg/dL 9.3  Total Protein 6.5 - 8.1 g/dL 7.0  Total Bilirubin 0.3 - 1.2 mg/dL 0.7  Alkaline Phos 38 - 126 U/L 87  AST 15 - 41 U/L 21  ALT 0 - 44 U/L 18    Lab Results  Component Value Date   WBC 5.4 11/18/2018   HGB 12.9 11/18/2018   HCT 39.7 11/18/2018   MCV 92.1 11/18/2018   PLT 165 11/18/2018   NEUTROABS 3.0 11/18/2018    ASSESSMENT & PLAN:  Malignant neoplasm of upper-inner quadrant of right breast in female, estrogen receptor positive ([place]) 11/13/2018:Routine screening mammogram detected a right breast mass, 2.6cm, at the 1 o'clock position. Biopsy showed IDC with DCIS, grade 2, HER-2 - (1+), ER+ 95%, PR+ 95%, Ki67 15%. T2N0 stage Ib Prior history of left mastectomy 1998 Treatment plan: 1. 12/24/2018:Right lumpectomy Lucia Gaskins): IDC with DCIS, 3.1cm, grade 2, clear margins, 3  right axillary lymph nodes negative. T2 N0 stage Ib 2.Oncotype: score 10, 3% chance of distant recurrence in 9 years with tamoxifen alone. 3. Adjuvant radiation therapy12/05/2018 4. Adjuvant antiestrogen therapywith anastrozole 1 mg daily x5 to 7 years --------------------------------------------------------------------------------------------------------------------------- Treatment plan: Adjuvant antiestrogen therapy with anastrozole started January15th2021 Anastrozole toxicities: Denies any major side effects.  She does have occasional hot flashes which are manageable. Occasional muscle cramps but no muscle stiffness or achiness. She had a pinched nerve in the back which she received an injection and is doing much better. Squamous cell skin cancer diagnosis: She is getting surgery November.  Breast cancer surveillance: 1.  Mammogram 11/08/2019: Benign breast density category C 2. breast exam March 2022: Benign  Return to clinic in 1 year for follow up    No orders of the defined types were placed in this encounter.  The patient has a good understanding of the overall plan. she agrees with it. she will call with any problems that  may develop before the next visit here.  Total time spent: 20 mins including face to face time and time spent for planning, charting and coordination of care  Rulon Eisenmenger, MD, MPH 05/11/2020  I, Cloyde Reams Dorshimer, am acting as scribe for Dr. Nicholas Lose.  I have reviewed the above documentation for accuracy and completeness, and I agree with the above.

## 2020-05-10 NOTE — Assessment & Plan Note (Signed)
11/13/2018:Routine screening mammogram detected a right breast mass, 2.6cm, at the 1 o'clock position. Biopsy showed IDC with DCIS, grade 2, HER-2 - (1+), ER+ 95%, PR+ 95%, Ki67 15%. T2N0 stage Ib Prior history of left mastectomy 1998 Treatment plan: 1. 12/24/2018:Right lumpectomy Lucia Gaskins): IDC with DCIS, 3.1cm, grade 2, clear margins, 3 right axillary lymph nodes negative. T2 N0 stage Ib 2.Oncotype: score 10, 3% chance of distant recurrence in 9 years with tamoxifen alone. 3. Adjuvant radiation therapy12/05/2018 4. Adjuvant antiestrogen therapywith anastrozole 1 mg daily x5 to 7 years --------------------------------------------------------------------------------------------------------------------------- Treatment plan: Adjuvant antiestrogen therapy with anastrozole started January15th2021 Anastrozole toxicities: Denies any major side effects.  She does have occasional hot flashes which are manageable. Occasional muscle cramps but no muscle stiffness or achiness. She had a pinched nerve in the back which she received an injection and is doing much better. Squamous cell skin cancer diagnosis: She is getting surgery November.  Breast cancer surveillance: 1.  Mammogram 11/08/2019: Benign breast density category C 2. breast exam March 2022: Benign  Return to clinic in 1 year for follow up

## 2020-05-11 ENCOUNTER — Other Ambulatory Visit: Payer: Self-pay

## 2020-05-11 ENCOUNTER — Telehealth: Payer: Self-pay | Admitting: Hematology and Oncology

## 2020-05-11 ENCOUNTER — Inpatient Hospital Stay: Payer: PPO | Attending: Hematology and Oncology | Admitting: Hematology and Oncology

## 2020-05-11 DIAGNOSIS — Z170 Estrogen receptor positive status [ER+]: Secondary | ICD-10-CM | POA: Insufficient documentation

## 2020-05-11 DIAGNOSIS — Z9012 Acquired absence of left breast and nipple: Secondary | ICD-10-CM | POA: Insufficient documentation

## 2020-05-11 DIAGNOSIS — C50211 Malignant neoplasm of upper-inner quadrant of right female breast: Secondary | ICD-10-CM | POA: Insufficient documentation

## 2020-05-11 DIAGNOSIS — Z79899 Other long term (current) drug therapy: Secondary | ICD-10-CM | POA: Diagnosis not present

## 2020-05-11 DIAGNOSIS — Z79811 Long term (current) use of aromatase inhibitors: Secondary | ICD-10-CM | POA: Diagnosis not present

## 2020-05-11 DIAGNOSIS — Z923 Personal history of irradiation: Secondary | ICD-10-CM | POA: Diagnosis not present

## 2020-05-11 NOTE — Telephone Encounter (Signed)
Scheduled appt per 3/24 los. Pt aware.  

## 2020-07-10 DIAGNOSIS — C50911 Malignant neoplasm of unspecified site of right female breast: Secondary | ICD-10-CM | POA: Diagnosis not present

## 2020-10-13 ENCOUNTER — Other Ambulatory Visit: Payer: Self-pay | Admitting: Internal Medicine

## 2020-10-13 DIAGNOSIS — Z853 Personal history of malignant neoplasm of breast: Secondary | ICD-10-CM

## 2020-11-06 ENCOUNTER — Other Ambulatory Visit: Payer: Self-pay | Admitting: Adult Health

## 2020-11-06 DIAGNOSIS — Z853 Personal history of malignant neoplasm of breast: Secondary | ICD-10-CM

## 2020-11-09 ENCOUNTER — Other Ambulatory Visit: Payer: Self-pay

## 2020-11-09 ENCOUNTER — Ambulatory Visit
Admission: RE | Admit: 2020-11-09 | Discharge: 2020-11-09 | Disposition: A | Discharge status: Home / Self Care | Payer: PPO | Source: Ambulatory Visit | Attending: Internal Medicine | Admitting: Internal Medicine

## 2020-11-09 DIAGNOSIS — R922 Inconclusive mammogram: Secondary | ICD-10-CM | POA: Diagnosis not present

## 2020-11-09 DIAGNOSIS — I788 Other diseases of capillaries: Secondary | ICD-10-CM | POA: Diagnosis not present

## 2020-11-09 DIAGNOSIS — Z853 Personal history of malignant neoplasm of breast: Secondary | ICD-10-CM

## 2020-11-09 DIAGNOSIS — B078 Other viral warts: Secondary | ICD-10-CM | POA: Diagnosis not present

## 2020-11-09 DIAGNOSIS — Z85828 Personal history of other malignant neoplasm of skin: Secondary | ICD-10-CM | POA: Diagnosis not present

## 2020-11-09 DIAGNOSIS — L821 Other seborrheic keratosis: Secondary | ICD-10-CM | POA: Diagnosis not present

## 2020-12-27 DIAGNOSIS — J029 Acute pharyngitis, unspecified: Secondary | ICD-10-CM | POA: Diagnosis not present

## 2021-01-31 DIAGNOSIS — Z85828 Personal history of other malignant neoplasm of skin: Secondary | ICD-10-CM | POA: Diagnosis not present

## 2021-01-31 DIAGNOSIS — D2271 Melanocytic nevi of right lower limb, including hip: Secondary | ICD-10-CM | POA: Diagnosis not present

## 2021-01-31 DIAGNOSIS — L570 Actinic keratosis: Secondary | ICD-10-CM | POA: Diagnosis not present

## 2021-01-31 DIAGNOSIS — L820 Inflamed seborrheic keratosis: Secondary | ICD-10-CM | POA: Diagnosis not present

## 2021-01-31 DIAGNOSIS — L814 Other melanin hyperpigmentation: Secondary | ICD-10-CM | POA: Diagnosis not present

## 2021-01-31 DIAGNOSIS — L821 Other seborrheic keratosis: Secondary | ICD-10-CM | POA: Diagnosis not present

## 2021-01-31 DIAGNOSIS — D225 Melanocytic nevi of trunk: Secondary | ICD-10-CM | POA: Diagnosis not present

## 2021-01-31 DIAGNOSIS — L918 Other hypertrophic disorders of the skin: Secondary | ICD-10-CM | POA: Diagnosis not present

## 2021-01-31 DIAGNOSIS — D2261 Melanocytic nevi of right upper limb, including shoulder: Secondary | ICD-10-CM | POA: Diagnosis not present

## 2021-03-30 DIAGNOSIS — C50911 Malignant neoplasm of unspecified site of right female breast: Secondary | ICD-10-CM | POA: Diagnosis not present

## 2021-03-30 DIAGNOSIS — Z853 Personal history of malignant neoplasm of breast: Secondary | ICD-10-CM | POA: Diagnosis not present

## 2021-04-02 DIAGNOSIS — Z0000 Encounter for general adult medical examination without abnormal findings: Secondary | ICD-10-CM | POA: Diagnosis not present

## 2021-04-02 DIAGNOSIS — Z853 Personal history of malignant neoplasm of breast: Secondary | ICD-10-CM | POA: Diagnosis not present

## 2021-04-02 DIAGNOSIS — N3281 Overactive bladder: Secondary | ICD-10-CM | POA: Diagnosis not present

## 2021-04-02 DIAGNOSIS — K219 Gastro-esophageal reflux disease without esophagitis: Secondary | ICD-10-CM | POA: Diagnosis not present

## 2021-04-02 DIAGNOSIS — Z1389 Encounter for screening for other disorder: Secondary | ICD-10-CM | POA: Diagnosis not present

## 2021-04-03 DIAGNOSIS — Z800 Family history of malignant neoplasm of digestive organs: Secondary | ICD-10-CM | POA: Diagnosis not present

## 2021-04-03 DIAGNOSIS — Z86010 Personal history of colonic polyps: Secondary | ICD-10-CM | POA: Diagnosis not present

## 2021-04-03 DIAGNOSIS — K5900 Constipation, unspecified: Secondary | ICD-10-CM | POA: Diagnosis not present

## 2021-04-03 DIAGNOSIS — K219 Gastro-esophageal reflux disease without esophagitis: Secondary | ICD-10-CM | POA: Diagnosis not present

## 2021-04-16 DIAGNOSIS — M25531 Pain in right wrist: Secondary | ICD-10-CM | POA: Diagnosis not present

## 2021-04-16 DIAGNOSIS — M7061 Trochanteric bursitis, right hip: Secondary | ICD-10-CM | POA: Diagnosis not present

## 2021-04-16 DIAGNOSIS — M5441 Lumbago with sciatica, right side: Secondary | ICD-10-CM | POA: Diagnosis not present

## 2021-04-17 ENCOUNTER — Other Ambulatory Visit: Payer: Self-pay | Admitting: Hematology and Oncology

## 2021-04-26 DIAGNOSIS — H2513 Age-related nuclear cataract, bilateral: Secondary | ICD-10-CM | POA: Diagnosis not present

## 2021-05-07 DIAGNOSIS — M545 Low back pain, unspecified: Secondary | ICD-10-CM | POA: Diagnosis not present

## 2021-05-11 DIAGNOSIS — M545 Low back pain, unspecified: Secondary | ICD-10-CM | POA: Diagnosis not present

## 2021-05-15 ENCOUNTER — Ambulatory Visit: Payer: PPO | Admitting: Hematology and Oncology

## 2021-05-23 NOTE — Progress Notes (Signed)
? ?Patient Care Team: ?Lavone Orn, MD as PCP - General (Internal Medicine) ?Alphonsa Overall, MD as Consulting Physician (General Surgery) ?Nicholas Lose, MD as Consulting Physician (Hematology and Oncology) ?Eppie Gibson, MD as Attending Physician (Radiation Oncology) ? ?DIAGNOSIS:  ?Encounter Diagnoses  ?Name Primary?  ? Malignant neoplasm of upper-inner quadrant of right breast in female, estrogen receptor positive ([place])   ? Post-menopausal Yes  ? ? ?SUMMARY OF ONCOLOGIC HISTORY: ?Oncology History  ?Malignant neoplasm of upper-inner quadrant of right breast in female, estrogen receptor positive ([place])  ?11/13/2018 Initial Diagnosis  ? Routine screening mammogram detected a right breast mass, 2.6cm, at the 1 o'clock position. Biopsy showed IDC with DCIS, grade 2, HER-2 - (1+), ER+ 95%, PR+ 95%, Ki67 15%. ?  ?11/18/2018 Cancer Staging  ? Staging form: Breast, AJCC 8th Edition ?- Clinical stage from 11/18/2018: Stage IB (cT2, cN0, cM0, G2, ER+, PR+, HER2-) ? ?  ?11/25/2018 Genetic Testing  ? Negative genetic testing on the [facility] Breast Cancer STAT panel and the Common Hereditary Cancers panel. The report date is 11/25/2018. ? ?The STAT Breast cancer panel offered by [facility] includes sequencing and rearrangement analysis for the following 9 genes:  ATM, BRCA1, BRCA2, CDH1, CHEK2, PALB2, PTEN, STK11 and TP53.  The Common Hereditary Cancers Panel offered by [facility] includes sequencing and/or deletion duplication testing of the following 48 genes: APC, ATM, AXIN2, BARD1, BMPR1A, BRCA1, BRCA2, BRIP1, CDH1, CDK4, CDKN2A (p14ARF), CDKN2A (p16INK4a), CHEK2, CTNNA1, DICER1, EPCAM (Deletion/duplication testing only), GREM1 (promoter region deletion/duplication testing only), KIT, MEN1, MLH1, MSH2, MSH3, MSH6, MUTYH, NBN, NF1, NHTL1, PALB2, PDGFRA, PMS2, POLD1, POLE, PTEN, RAD50, RAD51C, RAD51D, RNF43, SDHB, SDHC, SDHD, SMAD4, SMARCA4. STK11, TP53, TSC1, TSC2, and VHL.  The following genes were evaluated for sequence changes  only: SDHA and HOXB13 c.251G>A variant only.  ?  ?12/24/2018 Surgery  ? Right lumpectomy Lucia Gaskins) [phone]): IDC with DCIS, 3.1cm, grade 2, clear margins, 3 right axillary lymph nodes negative.  ER 95%, PR 95%, Ki-67 15%, HER-2 negative ?  ?12/24/2018 Oncotype testing  ? Oncotype: score 10, 3% chance of distant recurrence in 9 years with tamoxifen alone.  ?  ?12/31/2018 Cancer Staging  ? Staging form: Breast, AJCC 8th Edition ?- Pathologic stage from 12/31/2018: Stage IA (pT2, pN0, cM0, G2, ER+, PR+, HER2-) ? ?  ?01/22/2019 - 02/18/2019 Radiation Therapy  ? The patient initially received a dose of 40.05 Gy in 15 fractions to the breast using whole-breast tangent fields. This was delivered using a 3-D conformal technique. The pt received a boost delivering an additional 10 Gy in 5 fractions using a electron boost with 16mV electrons. The total dose was 50.05 Gy. ? ?  ?02/2019 - 02/2024 Anti-estrogen oral therapy  ? Anastrozole ?  ? ? ?CHIEF COMPLIANT: Follow-up of right breast cancer on anastrozole ? ?INTERVAL HISTORY: Ariel AKERis a  71y.o. with above-mentioned history of right breast cancer. She presents to the clinic today for follow-up ?She complains of hot flashes and some pain in hips but overall she is tolerating the anastrozole. ? ?ALLERGIES:  is allergic to elemental sulfur and sulfamethoxazole. ? ?MEDICATIONS:  ?Current Outpatient Medications  ?Medication Sig Dispense Refill  ? famotidine (PEPCID) 20 MG tablet Take 1 tablet (20 mg total) by mouth 2 (two) times daily.    ? meloxicam (MOBIC) 7.5 MG tablet Take 1 tablet (7.5 mg total) by mouth daily as needed for pain.    ? mirabegron ER (MYRBETRIQ) 25 MG TB24 tablet Take 1 tablet (25 mg  total) by mouth daily. 30 tablet   ? anastrozole (ARIMIDEX) 1 MG tablet TAKE 1 TABLET BY MOUTH EVERY DAY 90 tablet 3  ? Calcium Carbonate-Vit D-Min (CALTRATE 600+D PLUS PO) Take by mouth daily.    ? DOCUSATE SODIUM PO Take 50 mg by mouth as needed.    ? Magnesium  250 MG TABS Take by mouth as needed.    ? Multiple Vitamin (MULTIVITAMIN) tablet Take 1 tablet by mouth daily.    ? Probiotic Product (PROBIOTIC DAILY PO) Take by mouth.    ? Turmeric (QC TUMERIC COMPLEX PO) Take 1,400 mg by mouth daily.    ? UNABLE TO FIND Goli Apple Cider Gummies  ? ?For Acid Reflux    ? ?No current facility-administered medications for this visit.  ? ? ?PHYSICAL EXAMINATION: ?ECOG PERFORMANCE STATUS: 1 - Symptomatic but completely ambulatory ? ?Vitals:  ? 06/04/21 1019  ?BP: 133/67  ?Pulse: 75  ?Resp: 18  ?Temp: 97.7 ?F (36.5 ?C)  ?SpO2: 99%  ? ?Filed Weights  ? 06/04/21 1019  ?Weight: 148 lb 6.4 oz (67.3 kg)  ? ? ?BREAST: No palpable masses or nodules in either right or left breasts. No palpable axillary supraclavicular or infraclavicular adenopathy no breast tenderness or nipple discharge. (exam performed in the presence of a chaperone) ? ?LABORATORY DATA:  ?I have reviewed the data as listed ? ?  Latest Ref Rng & Units 11/18/2018  ?  8:24 AM  ?CMP  ?Glucose 70 - 99 mg/dL 60    ?BUN 8 - 23 mg/dL 15    ?Creatinine 0.44 - 1.00 mg/dL 0.84    ?Sodium 135 - 145 mmol/L 142    ?Potassium 3.5 - 5.1 mmol/L 3.6    ?Chloride 98 - 111 mmol/L 105    ?CO2 22 - 32 mmol/L 27    ?Calcium 8.9 - 10.3 mg/dL 9.3    ?Total Protein 6.5 - 8.1 g/dL 7.0    ?Total Bilirubin 0.3 - 1.2 mg/dL 0.7    ?Alkaline Phos 38 - 126 U/L 87    ?AST 15 - 41 U/L 21    ?ALT 0 - 44 U/L 18    ? ? ?Lab Results  ?Component Value Date  ? WBC 5.4 11/18/2018  ? HGB 12.9 11/18/2018  ? HCT 39.7 11/18/2018  ? MCV 92.1 11/18/2018  ? PLT 165 11/18/2018  ? NEUTROABS 3.0 11/18/2018  ? ? ?ASSESSMENT & PLAN:  ?Malignant neoplasm of upper-inner quadrant of right breast in female, estrogen receptor positive ([place]) ?11/13/2018:Routine screening mammogram detected a right breast mass, 2.6cm, at the 1 o'clock position. Biopsy showed IDC with DCIS, grade 2, HER-2 - (1+), ER+ 95%, PR+ 95%, Ki67 15%. ?T2N0 stage Ib ?Prior history of left mastectomy 1998   ?Treatment plan: ?1. 12/24/2018:Right lumpectomy Lucia Gaskins): IDC with DCIS, 3.1cm, grade 2, clear margins, 3 right axillary lymph nodes negative.  ?T2 N0 stage Ib ?2. Oncotype: score 10, 3% chance of distant recurrence in 9 years with tamoxifen alone.  ?3. Adjuvant radiation therapy 01/22/2019 ?4. Adjuvant antiestrogen therapy with anastrozole 1 mg daily x5 to 7 years ?--------------------------------------------------------------------------------------------------------------------------- ?Treatment plan: Adjuvant antiestrogen therapy with anastrozole started January 15th 2021 ?Anastrozole toxicities: ?Denies any major side effects.  She does have occasional hot flashes which are manageable. ?Occasional muscle cramps but no muscle stiffness or achiness. ?She had a pinched nerve in the back which she received an injection and is doing much better. ?Squamous cell skin cancer diagnosis: She is getting surgery November. ?  ?Breast cancer  surveillance: ?1.  Mammogram 11/09/2020: Benign breast density category C ?2. breast exam 06/04/2021: Benign ?  ?She traveled to [place] and [place] recently and came back last Friday. ?Return to clinic in 1 year for follow up ? ? ? ?Orders Placed This Encounter  ?Procedures  ? DG Bone Density  ?  Standing Status:   Future  ?  Standing Expiration Date:   06/04/2022  ?  Scheduling Instructions:  ?   Please schedule at same time as mammogram  ?  Order Specific Question:   Reason for Exam (SYMPTOM  OR DIAGNOSIS REQUIRED)  ?  Answer:   Post menopausal  ?  Order Specific Question:   Preferred imaging location?  ?  Answer:   [facility]  ?  Order Specific Question:   Release to patient  ?  Answer:   Immediate  ? ?The patient has a good understanding of the overall plan. she agrees with it. she will call with any problems that may develop before the next visit here. ?Total time spent: 20 mins including face to face time and time spent for planning, charting and co-ordination of care ? ?  Harriette Ohara, MD ?06/04/21 ? ? ? I Gardiner Coins am scribing for Dr. Lindi Adie ? ?I have reviewed the above documentation for accuracy and completeness, and I agree with the above. ?  ?

## 2021-06-04 ENCOUNTER — Other Ambulatory Visit: Payer: Self-pay

## 2021-06-04 ENCOUNTER — Inpatient Hospital Stay: Payer: PPO | Attending: Hematology and Oncology | Admitting: Hematology and Oncology

## 2021-06-04 VITALS — BP 133/67 | HR 75 | Temp 97.7°F | Resp 18 | Ht 64.0 in | Wt 148.4 lb

## 2021-06-04 DIAGNOSIS — Z79811 Long term (current) use of aromatase inhibitors: Secondary | ICD-10-CM | POA: Diagnosis not present

## 2021-06-04 DIAGNOSIS — C50211 Malignant neoplasm of upper-inner quadrant of right female breast: Secondary | ICD-10-CM | POA: Diagnosis not present

## 2021-06-04 DIAGNOSIS — Z79899 Other long term (current) drug therapy: Secondary | ICD-10-CM | POA: Diagnosis not present

## 2021-06-04 DIAGNOSIS — Z170 Estrogen receptor positive status [ER+]: Secondary | ICD-10-CM | POA: Diagnosis not present

## 2021-06-04 DIAGNOSIS — Z923 Personal history of irradiation: Secondary | ICD-10-CM | POA: Diagnosis not present

## 2021-06-04 DIAGNOSIS — Z780 Asymptomatic menopausal state: Secondary | ICD-10-CM | POA: Diagnosis not present

## 2021-06-04 MED ORDER — MIRABEGRON ER 25 MG PO TB24: 25.0000 mg | ORAL_TABLET | Freq: Every day | ORAL | Status: DC

## 2021-06-04 MED ORDER — MELOXICAM 7.5 MG PO TABS: 7.5000 mg | ORAL_TABLET | Freq: Every day | ORAL | Status: DC | PRN

## 2021-06-04 MED ORDER — FAMOTIDINE 20 MG PO TABS: 20.0000 mg | ORAL_TABLET | Freq: Two times a day (BID) | ORAL | Status: AC

## 2021-06-04 NOTE — Assessment & Plan Note (Signed)
11/13/2018:Routine screening mammogram detected a right breast mass, 2.6cm, at the 1 o'clock position. Biopsy showed IDC with DCIS, grade 2, HER-2 - (1+), ER+ 95%, PR+ 95%, Ki67 15%. ?T2N0 stage Ib ?Prior history of left mastectomy 1998? ?Treatment plan: ?1. 12/24/2018:Right lumpectomy Lucia Gaskins): IDC with DCIS, 3.1cm, grade 2, clear margins, 3 right axillary lymph nodes negative.? ?T2 N0 stage Ib ?2.?Oncotype: score 10, 3% chance of distant recurrence in 9 years with tamoxifen alone.? ?3. Adjuvant radiation therapy?01/22/2019 ?4. Adjuvant antiestrogen therapy?with anastrozole 1 mg daily x5 to 7 years ?--------------------------------------------------------------------------------------------------------------------------- ?Treatment plan: Adjuvant antiestrogen therapy with anastrozole?started?January?[id number] ?Anastrozole?toxicities: ?Denies any major side effects. ?She does have occasional hot flashes which are manageable. ?Occasional muscle cramps but no muscle stiffness or achiness. ?She had a pinched nerve in the back which she received an injection and is doing much better. ?Squamous cell skin cancer diagnosis: She is getting surgery November. ?? ?Breast cancer surveillance: ?1.??Mammogram 11/09/2020: Benign breast density category C ?2.?breast exam 06/04/2021: Benign ?? ?Return to clinic in 1 year for follow up ?

## 2021-09-11 DIAGNOSIS — K5900 Constipation, unspecified: Secondary | ICD-10-CM | POA: Diagnosis not present

## 2021-09-11 DIAGNOSIS — Z170 Estrogen receptor positive status [ER+]: Secondary | ICD-10-CM | POA: Diagnosis not present

## 2021-09-11 DIAGNOSIS — M5136 Other intervertebral disc degeneration, lumbar region: Secondary | ICD-10-CM | POA: Diagnosis not present

## 2021-09-11 DIAGNOSIS — N3281 Overactive bladder: Secondary | ICD-10-CM | POA: Diagnosis not present

## 2021-09-11 DIAGNOSIS — D126 Benign neoplasm of colon, unspecified: Secondary | ICD-10-CM | POA: Diagnosis not present

## 2021-09-11 DIAGNOSIS — C50912 Malignant neoplasm of unspecified site of left female breast: Secondary | ICD-10-CM | POA: Diagnosis not present

## 2021-09-11 DIAGNOSIS — M8580 Other specified disorders of bone density and structure, unspecified site: Secondary | ICD-10-CM | POA: Diagnosis not present

## 2021-09-11 DIAGNOSIS — C50911 Malignant neoplasm of unspecified site of right female breast: Secondary | ICD-10-CM | POA: Diagnosis not present

## 2021-10-10 ENCOUNTER — Other Ambulatory Visit: Payer: Self-pay | Admitting: Hematology and Oncology

## 2021-10-10 DIAGNOSIS — Z1231 Encounter for screening mammogram for malignant neoplasm of breast: Secondary | ICD-10-CM

## 2021-11-27 ENCOUNTER — Ambulatory Visit
Admission: RE | Admit: 2021-11-27 | Discharge: 2021-11-27 | Disposition: A | Discharge status: Home / Self Care | Payer: PPO | Source: Ambulatory Visit | Attending: Hematology and Oncology | Admitting: Hematology and Oncology

## 2021-11-27 DIAGNOSIS — Z780 Asymptomatic menopausal state: Secondary | ICD-10-CM | POA: Diagnosis not present

## 2021-11-27 DIAGNOSIS — Z1231 Encounter for screening mammogram for malignant neoplasm of breast: Secondary | ICD-10-CM

## 2021-11-27 DIAGNOSIS — M8589 Other specified disorders of bone density and structure, multiple sites: Secondary | ICD-10-CM | POA: Diagnosis not present

## 2021-12-31 DIAGNOSIS — N3945 Continuous leakage: Secondary | ICD-10-CM | POA: Diagnosis not present

## 2021-12-31 DIAGNOSIS — Z6827 Body mass index (BMI) 27.0-27.9, adult: Secondary | ICD-10-CM | POA: Diagnosis not present

## 2021-12-31 DIAGNOSIS — N8110 Cystocele, unspecified: Secondary | ICD-10-CM | POA: Diagnosis not present

## 2021-12-31 DIAGNOSIS — Z124 Encounter for screening for malignant neoplasm of cervix: Secondary | ICD-10-CM | POA: Diagnosis not present

## 2021-12-31 DIAGNOSIS — Z1151 Encounter for screening for human papillomavirus (HPV): Secondary | ICD-10-CM | POA: Diagnosis not present

## 2022-03-11 DIAGNOSIS — L700 Acne vulgaris: Secondary | ICD-10-CM | POA: Diagnosis not present

## 2022-03-11 DIAGNOSIS — L814 Other melanin hyperpigmentation: Secondary | ICD-10-CM | POA: Diagnosis not present

## 2022-03-11 DIAGNOSIS — D2272 Melanocytic nevi of left lower limb, including hip: Secondary | ICD-10-CM | POA: Diagnosis not present

## 2022-03-11 DIAGNOSIS — L918 Other hypertrophic disorders of the skin: Secondary | ICD-10-CM | POA: Diagnosis not present

## 2022-03-11 DIAGNOSIS — D2261 Melanocytic nevi of right upper limb, including shoulder: Secondary | ICD-10-CM | POA: Diagnosis not present

## 2022-03-11 DIAGNOSIS — D225 Melanocytic nevi of trunk: Secondary | ICD-10-CM | POA: Diagnosis not present

## 2022-03-11 DIAGNOSIS — Z85828 Personal history of other malignant neoplasm of skin: Secondary | ICD-10-CM | POA: Diagnosis not present

## 2022-03-11 DIAGNOSIS — L821 Other seborrheic keratosis: Secondary | ICD-10-CM | POA: Diagnosis not present

## 2022-04-25 ENCOUNTER — Other Ambulatory Visit: Payer: Self-pay | Admitting: Hematology and Oncology

## 2022-04-29 DIAGNOSIS — H2513 Age-related nuclear cataract, bilateral: Secondary | ICD-10-CM | POA: Diagnosis not present

## 2022-05-02 DIAGNOSIS — Z86010 Personal history of colonic polyps: Secondary | ICD-10-CM | POA: Diagnosis not present

## 2022-05-02 DIAGNOSIS — R140 Abdominal distension (gaseous): Secondary | ICD-10-CM | POA: Diagnosis not present

## 2022-05-02 DIAGNOSIS — K219 Gastro-esophageal reflux disease without esophagitis: Secondary | ICD-10-CM | POA: Diagnosis not present

## 2022-05-02 DIAGNOSIS — R194 Change in bowel habit: Secondary | ICD-10-CM | POA: Diagnosis not present

## 2022-05-02 DIAGNOSIS — Z800 Family history of malignant neoplasm of digestive organs: Secondary | ICD-10-CM | POA: Diagnosis not present

## 2022-05-15 DIAGNOSIS — N3281 Overactive bladder: Secondary | ICD-10-CM | POA: Diagnosis not present

## 2022-05-15 DIAGNOSIS — R7989 Other specified abnormal findings of blood chemistry: Secondary | ICD-10-CM | POA: Diagnosis not present

## 2022-05-15 DIAGNOSIS — K5900 Constipation, unspecified: Secondary | ICD-10-CM | POA: Diagnosis not present

## 2022-05-27 DIAGNOSIS — C50912 Malignant neoplasm of unspecified site of left female breast: Secondary | ICD-10-CM | POA: Diagnosis not present

## 2022-05-27 DIAGNOSIS — Z0000 Encounter for general adult medical examination without abnormal findings: Secondary | ICD-10-CM | POA: Diagnosis not present

## 2022-05-27 DIAGNOSIS — Z23 Encounter for immunization: Secondary | ICD-10-CM | POA: Diagnosis not present

## 2022-05-27 DIAGNOSIS — R194 Change in bowel habit: Secondary | ICD-10-CM | POA: Diagnosis not present

## 2022-05-27 DIAGNOSIS — N3281 Overactive bladder: Secondary | ICD-10-CM | POA: Diagnosis not present

## 2022-05-27 DIAGNOSIS — M5136 Other intervertebral disc degeneration, lumbar region: Secondary | ICD-10-CM | POA: Diagnosis not present

## 2022-05-27 DIAGNOSIS — Z170 Estrogen receptor positive status [ER+]: Secondary | ICD-10-CM | POA: Diagnosis not present

## 2022-05-27 DIAGNOSIS — Z1212 Encounter for screening for malignant neoplasm of rectum: Secondary | ICD-10-CM | POA: Diagnosis not present

## 2022-05-27 DIAGNOSIS — M8580 Other specified disorders of bone density and structure, unspecified site: Secondary | ICD-10-CM | POA: Diagnosis not present

## 2022-05-27 DIAGNOSIS — R82998 Other abnormal findings in urine: Secondary | ICD-10-CM | POA: Diagnosis not present

## 2022-05-27 DIAGNOSIS — D126 Benign neoplasm of colon, unspecified: Secondary | ICD-10-CM | POA: Diagnosis not present

## 2022-05-27 DIAGNOSIS — C50911 Malignant neoplasm of unspecified site of right female breast: Secondary | ICD-10-CM | POA: Diagnosis not present

## 2022-06-05 ENCOUNTER — Inpatient Hospital Stay: Payer: PPO | Admitting: Hematology and Oncology

## 2022-06-05 NOTE — Assessment & Plan Note (Deleted)
11/13/2018:Routine screening mammogram detected a right breast mass, 2.6cm, at the 1 o'clock position. Biopsy showed IDC with DCIS, grade 2, HER-2 - (1+), ER+ 95%, PR+ 95%, Ki67 15%. T2N0 stage Ib Prior history of left mastectomy 1998  Treatment plan: 1. 12/24/2018:Right lumpectomy Ezzard Standing): IDC with DCIS, 3.1cm, grade 2, clear margins, 3 right axillary lymph nodes negative.  T2 N0 stage Ib 2. Oncotype: score 10, 3% chance of distant recurrence in 9 years with tamoxifen alone.  3. Adjuvant radiation therapy 01/22/2019 4. Adjuvant antiestrogen therapy with anastrozole 1 mg daily x5 to 7 years --------------------------------------------------------------------------------------------------------------------------- Treatment plan: Adjuvant antiestrogen therapy with anastrozole started January 15th 2021 Anastrozole toxicities: Denies any major side effects.  She does have occasional hot flashes which are manageable. Occasional muscle cramps but no muscle stiffness or achiness. She had a pinched nerve in the back which she received an injection and is doing much better. Squamous cell skin cancer diagnosis: She is getting surgery November.   Breast cancer surveillance: 1.  Mammogram 11/28/21: Benign breast density category C 2. breast exam 06/05/2022: Benign   Bone Density: 11/27/21: T score -2 She traveled to [place] and [place] recently and came back last Friday. Return to clinic in 1 year for follow up

## 2022-06-24 DIAGNOSIS — Z85828 Personal history of other malignant neoplasm of skin: Secondary | ICD-10-CM | POA: Diagnosis not present

## 2022-06-24 DIAGNOSIS — L720 Epidermal cyst: Secondary | ICD-10-CM | POA: Diagnosis not present

## 2022-07-27 ENCOUNTER — Telehealth: Payer: Self-pay | Admitting: Hematology and Oncology

## 2022-07-31 ENCOUNTER — Telehealth: Payer: Self-pay | Admitting: Hematology and Oncology

## 2022-08-06 DIAGNOSIS — R32 Unspecified urinary incontinence: Secondary | ICD-10-CM | POA: Diagnosis not present

## 2022-08-21 ENCOUNTER — Ambulatory Visit: Payer: PPO | Admitting: Hematology and Oncology

## 2022-08-29 DIAGNOSIS — N3946 Mixed incontinence: Secondary | ICD-10-CM | POA: Diagnosis not present

## 2022-08-29 NOTE — Progress Notes (Signed)
Patient Care Team: Kirby Funk, MD (Inactive) as PCP - General (Internal Medicine) Ovidio Kin, MD as Consulting Physician (General Surgery) Serena Croissant, MD as Consulting Physician (Hematology and Oncology) Lonie Peak, MD as Attending Physician (Radiation Oncology)  DIAGNOSIS:  Encounter Diagnosis  Name Primary?   Malignant neoplasm of upper-inner quadrant of right breast in female, estrogen receptor positive ([facility]) Yes    SUMMARY OF ONCOLOGIC HISTORY: Oncology History  Malignant neoplasm of upper-inner quadrant of right breast in female, estrogen receptor positive ([facility])  11/13/2018 Initial Diagnosis   Routine screening mammogram detected a right breast mass, 2.6cm, at the 1 o'clock position. Biopsy showed IDC with DCIS, grade 2, HER-2 - (1+), ER+ 95%, PR+ 95%, Ki67 15%.   11/18/2018 Cancer Staging   Staging form: Breast, AJCC 8th Edition - Clinical stage from 11/18/2018: Stage IB (cT2, cN0, cM0, G2, ER+, PR+, HER2-)    11/25/2018 Genetic Testing   Negative genetic testing on the [facility] Breast Cancer STAT panel and the Common Hereditary Cancers panel. The report date is 11/25/2018.  The STAT Breast cancer panel offered by [facility] includes sequencing and rearrangement analysis for the following 9 genes:  ATM, BRCA1, BRCA2, CDH1, CHEK2, PALB2, PTEN, STK11 and TP53.  The Common Hereditary Cancers Panel offered by [facility] includes sequencing and/or deletion duplication testing of the following 48 genes: APC, ATM, AXIN2, BARD1, BMPR1A, BRCA1, BRCA2, BRIP1, CDH1, CDK4, CDKN2A (p14ARF), CDKN2A (p16INK4a), CHEK2, CTNNA1, DICER1, EPCAM (Deletion/duplication testing only), GREM1 (promoter region deletion/duplication testing only), KIT, MEN1, MLH1, MSH2, MSH3, MSH6, MUTYH, NBN, NF1, NHTL1, PALB2, PDGFRA, PMS2, POLD1, POLE, PTEN, RAD50, RAD51C, RAD51D, RNF43, SDHB, SDHC, SDHD, SMAD4, SMARCA4. STK11, TP53, TSC1, TSC2, and VHL.  The following genes were evaluated for sequence changes only: SDHA  and HOXB13 c.251G>A variant only.    12/24/2018 Surgery   Right lumpectomy Ezzard Standing) [id number]): IDC with DCIS, 3.1cm, grade 2, clear margins, 3 right axillary lymph nodes negative.  ER 95%, PR 95%, Ki-67 15%, HER-2 negative   12/24/2018 Oncotype testing   Oncotype: score 10, 3% chance of distant recurrence in 9 years with tamoxifen alone.    12/31/2018 Cancer Staging   Staging form: Breast, AJCC 8th Edition - Pathologic stage from 12/31/2018: Stage IA (pT2, pN0, cM0, G2, ER+, PR+, HER2-)    01/22/2019 - 02/18/2019 Radiation Therapy   The patient initially received a dose of 40.05 Gy in 15 fractions to the breast using whole-breast tangent fields. This was delivered using a 3-D conformal technique. The pt received a boost delivering an additional 10 Gy in 5 fractions using a electron boost with electrons. The total dose was 50.05 Gy.    02/2019 - 02/2024 Anti-estrogen oral therapy   Anastrozole     CHIEF COMPLIANT: Follow-up of right breast cancer on anastrozole   INTERVAL HISTORY: Ariel Johnson is a 72 y.o. with above-mentioned history of right breast cancer. She presents to the clinic today for follow-up. Pt reports that she is doing fairly well. She is tolerating the anastrozole extremely well with no side effects or complaints.    ALLERGIES:  is allergic to elemental sulfur and sulfamethoxazole.  MEDICATIONS:  Current Outpatient Medications  Medication Sig Dispense Refill   anastrozole (ARIMIDEX) 1 MG tablet TAKE 1 TABLET BY MOUTH EVERY DAY 90 tablet 3   Calcium Carbonate-Vit D-Min (CALTRATE 600+D PLUS PO) Take by mouth daily.     colesevelam (WELCHOL) 625 MG tablet Take by mouth.     DOCUSATE SODIUM PO Take 50 mg by mouth  as needed.     famotidine (PEPCID) 20 MG tablet Take 1 tablet (20 mg total) by mouth 2 (two) times daily.     GEMTESA 75 MG TABS 75 mg.     Magnesium 250 MG TABS Take by mouth as needed.     mirabegron ER (MYRBETRIQ) 25 MG TB24 tablet Take 1  tablet (25 mg total) by mouth daily. 30 tablet    Multiple Vitamin (MULTIVITAMIN) tablet Take 1 tablet by mouth daily.     Probiotic Product (PROBIOTIC DAILY PO) Take by mouth.     Turmeric (QC TUMERIC COMPLEX PO) Take 1,400 mg by mouth daily.     UNABLE TO FIND Goli Apple Cider Gummies   For Acid Reflux     No current facility-administered medications for this visit.    PHYSICAL EXAMINATION: ECOG PERFORMANCE STATUS: 0 - Asymptomatic  Vitals:   08/30/22 1122  BP: 127/70  Pulse: 81  Resp: 18  Temp: 97.9 F (36.6 C)  SpO2: 96%   Filed Weights   08/30/22 1122  Weight: 148 lb 5 oz (67.3 kg)    BREAST: No palpable masses or nodules in either right or left reconstructed breasts. No palpable axillary supraclavicular or infraclavicular adenopathy no breast tenderness or nipple discharge. (exam performed in the presence of a chaperone)  LABORATORY DATA:  I have reviewed the data as listed    Latest Ref Rng & Units 11/18/2018    8:24 AM  CMP  Glucose 70 - 99 mg/dL 60   BUN 8 - 23 mg/dL 15   Creatinine 6.57 - 1.00 mg/dL 8.46   Sodium 962 - 952 mmol/L 142   Potassium 3.5 - 5.1 mmol/L 3.6   Chloride 98 - 111 mmol/L 105   CO2 22 - 32 mmol/L 27   Calcium 8.9 - 10.3 mg/dL 9.3   Total Protein 6.5 - 8.1 g/dL 7.0   Total Bilirubin 0.3 - 1.2 mg/dL 0.7   Alkaline Phos 38 - 126 U/L 87   AST 15 - 41 U/L 21   ALT 0 - 44 U/L 18     Lab Results  Component Value Date   WBC 5.4 11/18/2018   HGB 12.9 11/18/2018   HCT 39.7 11/18/2018   MCV 92.1 11/18/2018   PLT 165 11/18/2018   NEUTROABS 3.0 11/18/2018    ASSESSMENT & PLAN:  Malignant neoplasm of upper-inner quadrant of right breast in female, estrogen receptor positive ([facility]) 11/13/2018:Routine screening mammogram detected a right breast mass, 2.6cm, at the 1 o'clock position. Biopsy showed IDC with DCIS, grade 2, HER-2 - (1+), ER+ 95%, PR+ 95%, Ki67 15%. T2N0 stage Ib Prior history of left mastectomy 1998  Treatment plan: 1.  12/24/2018:Right lumpectomy Ezzard Standing): IDC with DCIS, 3.1cm, grade 2, clear margins, 3 right axillary lymph nodes negative.  T2 N0 stage Ib 2. Oncotype: score 10, 3% chance of distant recurrence in 9 years with tamoxifen alone.  3. Adjuvant radiation therapy 01/22/2019 4. Adjuvant antiestrogen therapy with anastrozole 1 mg daily x5 to 7 years --------------------------------------------------------------------------------------------------------------------------- Treatment plan: Adjuvant antiestrogen therapy with anastrozole started January 15th 2021 Anastrozole toxicities: Denies any major side effects.  She does have occasional hot flashes which are manageable.   Breast cancer surveillance: 1.  Mammogram 11/28/2021 right breast: Benign breast density category B 2. breast exam 08/30/2022: Benign 3.  Bone density 11/27/2021: T-score -2: Osteopenia: Recommended calcium and vitamin D   She plans to go to [place] in August Return to clinic in 1 year for follow up  No orders of the defined types were placed in this encounter.  The patient has a good understanding of the overall plan. she agrees with it. she will call with any problems that may develop before the next visit here. Total time spent: 20 mins including face to face time and time spent for planning, charting and co-ordination of care   Tamsen Meek, MD 08/30/22    I Janan Ridge am acting as a Neurosurgeon for [employer]  I have reviewed the above documentation for accuracy and completeness, and I agree with the above.

## 2022-08-30 ENCOUNTER — Other Ambulatory Visit: Payer: Self-pay

## 2022-08-30 ENCOUNTER — Inpatient Hospital Stay: Payer: PPO | Attending: Hematology and Oncology | Admitting: Hematology and Oncology

## 2022-08-30 VITALS — BP 127/70 | HR 81 | Temp 97.9°F | Resp 18 | Wt 148.3 lb

## 2022-08-30 DIAGNOSIS — C50211 Malignant neoplasm of upper-inner quadrant of right female breast: Secondary | ICD-10-CM | POA: Insufficient documentation

## 2022-08-30 DIAGNOSIS — Z170 Estrogen receptor positive status [ER+]: Secondary | ICD-10-CM | POA: Diagnosis not present

## 2022-08-30 DIAGNOSIS — Z923 Personal history of irradiation: Secondary | ICD-10-CM | POA: Insufficient documentation

## 2022-08-30 DIAGNOSIS — Z9012 Acquired absence of left breast and nipple: Secondary | ICD-10-CM | POA: Diagnosis not present

## 2022-08-30 DIAGNOSIS — Z79811 Long term (current) use of aromatase inhibitors: Secondary | ICD-10-CM | POA: Diagnosis not present

## 2022-08-30 NOTE — Assessment & Plan Note (Signed)
11/13/2018:Routine screening mammogram detected a right breast mass, 2.6cm, at the 1 o'clock position. Biopsy showed IDC with DCIS, grade 2, HER-2 - (1+), ER+ 95%, PR+ 95%, Ki67 15%. T2N0 stage Ib Prior history of left mastectomy 1998  Treatment plan: 1. 12/24/2018:Right lumpectomy Ezzard Standing): IDC with DCIS, 3.1cm, grade 2, clear margins, 3 right axillary lymph nodes negative.  T2 N0 stage Ib 2. Oncotype: score 10, 3% chance of distant recurrence in 9 years with tamoxifen alone.  3. Adjuvant radiation therapy 01/22/2019 4. Adjuvant antiestrogen therapy with anastrozole 1 mg daily x5 to 7 years --------------------------------------------------------------------------------------------------------------------------- Treatment plan: Adjuvant antiestrogen therapy with anastrozole started January 15th 2021 Anastrozole toxicities: Denies any major side effects.  She does have occasional hot flashes which are manageable. Occasional muscle cramps but no muscle stiffness or achiness. She had a pinched nerve in the back which she received an injection and is doing much better. Squamous cell skin cancer diagnosis: She is getting surgery November.   Breast cancer surveillance: 1.  Mammogram 11/28/2021 right breast: Benign breast density category B 2. breast exam 08/30/2022: Benign 3.  Bone density 11/27/2021: T-score -2: Osteopenia: Recommended calcium and vitamin D   She traveled to [place] and [place] recently and came back last Friday. Return to clinic in 1 year for follow up

## 2022-09-18 DIAGNOSIS — R32 Unspecified urinary incontinence: Secondary | ICD-10-CM | POA: Diagnosis not present

## 2022-09-18 DIAGNOSIS — N8110 Cystocele, unspecified: Secondary | ICD-10-CM | POA: Diagnosis not present

## 2022-09-24 DIAGNOSIS — R32 Unspecified urinary incontinence: Secondary | ICD-10-CM | POA: Diagnosis not present

## 2022-10-12 ENCOUNTER — Encounter (HOSPITAL_COMMUNITY): Payer: Self-pay

## 2022-10-12 ENCOUNTER — Other Ambulatory Visit: Payer: Self-pay

## 2022-10-12 ENCOUNTER — Emergency Department (HOSPITAL_COMMUNITY): Payer: PPO

## 2022-10-12 ENCOUNTER — Emergency Department (HOSPITAL_COMMUNITY)
Admission: EM | Admit: 2022-10-12 | Discharge: 2022-10-12 | Disposition: A | Discharge status: Home / Self Care | Payer: PPO | Attending: Emergency Medicine | Admitting: Emergency Medicine

## 2022-10-12 DIAGNOSIS — I959 Hypotension, unspecified: Secondary | ICD-10-CM | POA: Diagnosis not present

## 2022-10-12 DIAGNOSIS — R531 Weakness: Secondary | ICD-10-CM | POA: Diagnosis not present

## 2022-10-12 DIAGNOSIS — R55 Syncope and collapse: Secondary | ICD-10-CM | POA: Diagnosis not present

## 2022-10-12 DIAGNOSIS — R0602 Shortness of breath: Secondary | ICD-10-CM | POA: Diagnosis not present

## 2022-10-12 DIAGNOSIS — R42 Dizziness and giddiness: Secondary | ICD-10-CM | POA: Diagnosis not present

## 2022-10-12 DIAGNOSIS — R110 Nausea: Secondary | ICD-10-CM | POA: Diagnosis not present

## 2022-10-12 DIAGNOSIS — R404 Transient alteration of awareness: Secondary | ICD-10-CM | POA: Diagnosis not present

## 2022-10-12 LAB — COMPREHENSIVE METABOLIC PANEL
ALT: 19 U/L (ref 0–44)
AST: 24 U/L (ref 15–41)
Albumin: 3.6 g/dL (ref 3.5–5.0)
Alkaline Phosphatase: 74 U/L (ref 38–126)
Anion gap: 13 (ref 5–15)
BUN: 13 mg/dL (ref 8–23)
CO2: 21 mmol/L — ABNORMAL LOW (ref 22–32)
Calcium: 9 mg/dL (ref 8.9–10.3)
Chloride: 104 mmol/L (ref 98–111)
Creatinine, Ser: 0.86 mg/dL (ref 0.44–1.00)
GFR, Estimated: >60 mL/min (ref >60)
Glucose, Bld: 134 mg/dL — ABNORMAL HIGH (ref 70–99)
Potassium: 3.2 mmol/L — ABNORMAL LOW (ref 3.5–5.1)
Sodium: 138 mmol/L (ref 135–145)
Total Bilirubin: 0.4 mg/dL (ref 0.3–1.2)
Total Protein: 6.3 g/dL — ABNORMAL LOW (ref 6.5–8.1)

## 2022-10-12 LAB — URINALYSIS, ROUTINE W REFLEX MICROSCOPIC
Bilirubin Urine: NEGATIVE
Glucose, UA: NEGATIVE mg/dL
Ketones, ur: NEGATIVE mg/dL
Nitrite: NEGATIVE
Protein, ur: NEGATIVE mg/dL
Specific Gravity, Urine: 1.011 (ref 1.005–1.030)
pH: 5 (ref 5.0–8.0)

## 2022-10-12 LAB — CBC WITH DIFFERENTIAL/PLATELET
Abs Immature Granulocytes: 0.01 10*3/uL (ref 0.00–0.07)
Basophils Absolute: 0 10*3/uL (ref 0.0–0.1)
Basophils Relative: 1 %
Eosinophils Absolute: 0.1 10*3/uL (ref 0.0–0.5)
Eosinophils Relative: 3 %
HCT: 35.2 % — ABNORMAL LOW (ref 36.0–46.0)
Hemoglobin: 11.5 g/dL — ABNORMAL LOW (ref 12.0–15.0)
Immature Granulocytes: 0 %
Lymphocytes Relative: 40 %
Lymphs Abs: 1.9 10*3/uL (ref 0.7–4.0)
MCH: 29.3 pg (ref 26.0–34.0)
MCHC: 32.7 g/dL (ref 30.0–36.0)
MCV: 89.8 fL (ref 80.0–100.0)
Monocytes Absolute: 0.6 10*3/uL (ref 0.1–1.0)
Monocytes Relative: 13 %
Neutro Abs: 2 10*3/uL (ref 1.7–7.7)
Neutrophils Relative %: 43 %
Platelets: 158 10*3/uL (ref 150–400)
RBC: 3.92 MIL/uL (ref 3.87–5.11)
RDW: 13.4 % (ref 11.5–15.5)
WBC: 4.7 10*3/uL (ref 4.0–10.5)
nRBC: 0 % (ref 0.0–0.2)

## 2022-10-12 LAB — TROPONIN I (HIGH SENSITIVITY)
Troponin I (High Sensitivity): 3 ng/L (ref <18)
Troponin I (High Sensitivity): 3 ng/L (ref <18)

## 2022-10-12 NOTE — ED Triage Notes (Signed)
Pt BIB EMS after having an episode of generalized weakness, dizziness, and hypotension tonight. Per EMS, pt was playing a card game with family when she had a sudden onset of generalized weakness, dizziness, and questionable syncope. Per EMS, pt was hypotensive on arrival. Pt denies CP, SOB, or sick symptoms. Pt a&ox4. Pt received of NS and 4mg  of zofran enroute.

## 2022-10-12 NOTE — ED Provider Notes (Signed)
[place] EMERGENCY DEPARTMENT AT [facility] Provider Note   CSN: [id number] Arrival date & time: 10/12/22  0035     History  Chief Complaint  Patient presents with   Weakness    Ariel Johnson is a 72 y.o. female.  Presents to the emerged department for evaluation of syncope/presyncope.  Patient was with her family when this occurred.  They were playing a game together when she stood up and suddenly became weak and dizzy.  Family reports that the color washed out from her face and she briefly was not responding.  Eyes were closed, no seizure activity.  Family reports that it look like she was having difficulty breathing.  Patient reports that she does not exactly remember what happened, became alert again and immediately recognized her family.  Patient had drank some wine and taken a delta 9 gummy tonight which she has never done before.  Patient initially hypotensive for EMS but alert.  She was given fluids and Zofran during transport.  Patient reports that she just feels tired now.       Home Medications Prior to Admission medications   Medication Sig Start Date End Date Taking? Authorizing Provider  anastrozole (ARIMIDEX) 1 MG tablet TAKE 1 TABLET BY MOUTH EVERY DAY 04/26/22   Serena Croissant, MD  Calcium Carbonate-Vit D-Min (CALTRATE 600+D PLUS PO) Take by mouth daily.    [provider]  colesevelam (WELCHOL) 625 MG tablet Take by mouth.    [provider]  DOCUSATE SODIUM PO Take 50 mg by mouth as needed.    [provider]  famotidine (PEPCID) 20 MG tablet Take 1 tablet (20 mg total) by mouth 2 (two) times daily. 06/04/21   Serena Croissant, MD  GEMTESA 75 MG TABS 75 mg. 08/06/22   [provider]  Magnesium 250 MG TABS Take by mouth as needed.    [provider]  mirabegron ER (MYRBETRIQ) 25 MG TB24 tablet Take 1 tablet (25 mg total) by mouth daily. 06/04/21   Serena Croissant, MD  Multiple Vitamin (MULTIVITAMIN) tablet Take 1  tablet by mouth daily.    [provider]  Probiotic Product (PROBIOTIC DAILY PO) Take by mouth.    [provider]  Turmeric (QC TUMERIC COMPLEX PO) Take 1,400 mg by mouth daily.    [provider]  UNABLE TO FIND [employer] Gummies   For Acid Reflux    [provider]      Allergies    Elemental sulfur and Sulfamethoxazole    Review of Systems   Review of Systems  Physical Exam Updated Vital Signs BP (!) 103/51   Pulse 85   Temp (!) 97.5 F (36.4 C) (Oral)   Resp 15   Wt 65.8 kg   SpO2 97%   BMI 24.89 kg/m  Physical Exam Vitals and nursing note reviewed.  Constitutional:      General: She is not in acute distress.    Appearance: She is well-developed.  HENT:     Head: Normocephalic and atraumatic.     Mouth/Throat:     Mouth: Mucous membranes are moist.  Eyes:     General: Vision grossly intact. Gaze aligned appropriately.     Extraocular Movements: Extraocular movements intact.     Conjunctiva/sclera: Conjunctivae normal.  Cardiovascular:     Rate and Rhythm: Normal rate and regular rhythm.     Pulses: Normal pulses.     Heart sounds: Normal heart sounds, S1 normal and  S2 normal. No murmur heard.    No friction rub. No gallop.  Pulmonary:     Effort: Pulmonary effort is normal. No respiratory distress.     Breath sounds: Normal breath sounds.  Abdominal:     General: Bowel sounds are normal.     Palpations: Abdomen is soft.     Tenderness: There is no abdominal tenderness. There is no guarding or rebound.     Hernia: No hernia is present.  Musculoskeletal:        General: No swelling.     Cervical back: Full passive range of motion without pain, normal range of motion and neck supple. No spinous process tenderness or muscular tenderness. Normal range of motion.     Right lower leg: No edema.     Left lower leg: No edema.  Skin:    General: Skin is warm and dry.     Capillary Refill: Capillary refill takes less than  2 seconds.     Findings: No ecchymosis, erythema, rash or wound.  Neurological:     General: No focal deficit present.     Mental Status: She is alert and oriented to person, place, and time.     GCS: GCS eye subscore is 4. GCS verbal subscore is 5. GCS motor subscore is 6.     Cranial Nerves: Cranial nerves 2-12 are intact.     Sensory: Sensation is intact.     Motor: Motor function is intact.     Coordination: Coordination is intact.  Psychiatric:        Attention and Perception: Attention normal.        Mood and Affect: Mood normal.        Speech: Speech normal.        Behavior: Behavior normal.     ED Results / Procedures / Treatments   Labs (all labs ordered are listed, but only abnormal results are displayed) Labs Reviewed  CBC WITH DIFFERENTIAL/PLATELET - Abnormal; Notable for the following components:      Result Value   Hemoglobin 11.5 (*)    HCT 35.2 (*)    All other components within normal limits  COMPREHENSIVE METABOLIC PANEL - Abnormal; Notable for the following components:   Potassium 3.2 (*)    CO2 21 (*)    Glucose, Bld 134 (*)    Total Protein 6.3 (*)    All other components within normal limits  URINALYSIS, ROUTINE W REFLEX MICROSCOPIC - Abnormal; Notable for the following components:   Hgb urine dipstick SMALL (*)    Leukocytes,Ua MODERATE (*)    Bacteria, UA RARE (*)    All other components within normal limits  TROPONIN I (HIGH SENSITIVITY)  TROPONIN I (HIGH SENSITIVITY)    EKG EKG Interpretation Date/Time:  Saturday October 12 2022 00:45:40 EDT Ventricular Rate:  83 PR Interval:  182 QRS Duration:  95 QT Interval:  405 QTC Calculation: 476 R Axis:   82  Text Interpretation: Sinus rhythm Borderline right axis deviation Confirmed by Gilda Crease [phone]) on 10/12/2022 2:34:54 AM  Radiology DG Chest Port 1 View  Result Date: 10/12/2022 CLINICAL DATA:  Shortness of breath EXAM: PORTABLE CHEST 1 VIEW COMPARISON:  None Available.  FINDINGS: The heart size and mediastinal contours are within normal limits. Both lungs are clear. The visualized skeletal structures are unremarkable. Postsurgical changes in the left breast are noted. IMPRESSION: No active disease. Electronically Signed   By: Alcide Clever M.D.   On: 10/12/2022 01:12  Procedures Procedures    Medications Ordered in ED Medications - No data to display  ED Course/ Medical Decision Making/ A&P                                 Medical Decision Making Amount and/or Complexity of Data Reviewed Labs: ordered. Decision-making details documented in ED Course. Radiology: ordered and independent interpretation performed. Decision-making details documented in ED Course. ECG/medicine tests: ordered and independent interpretation performed. Decision-making details documented in ED Course.   Differential Diagnosis considered includes, but not limited to: Arrhythmia; MI; vasovagal episode; seizure; toxidrome; PE; stroke  Presents to the emergency department for evaluation of syncope.  Patient had been sitting for a while when she got up and the episode occurred.  She does admit that she had some wine tonight and also used a delta-9 gummy for the first time.  Patient feels generalized weakness at arrival but otherwise no specific complaints.  Examination unremarkable, no neurologic deficits.  No arrhythmia on the monitor.  Workup reassuring.  Vital signs have remained stable.  Suspect multifactorial vasovagal episode.  Discharged to home.        Final Clinical Impression(s) / ED Diagnoses Final diagnoses:  Syncope, unspecified syncope type    Rx / DC Orders ED Discharge Orders     None         Sheetal Lyall, Canary Brim, MD 10/12/22 [phone]

## 2022-10-24 ENCOUNTER — Telehealth: Payer: Self-pay

## 2022-10-24 NOTE — Telephone Encounter (Signed)
Transition Care Management Unsuccessful Follow-up Telephone Call  Date of discharge and from where:  10/12/2022 [facility]  Attempts:  1st Attempt  Reason for unsuccessful TCM follow-up call:  Left voice message  Mintie Witherington [facility]  [facility] Population Health Community Resource Care Guide   [email or web address]  ?? [phone]   Website: [email or web address]  [email or web address]

## 2022-10-25 ENCOUNTER — Telehealth: Payer: Self-pay

## 2022-10-25 NOTE — Telephone Encounter (Signed)
Transition Care Management Follow-up Telephone Call Date of discharge and from where: 10/12/2022 [facility] How have you been since you were released from the hospital? Patient stated she is feeling much better. Any questions or concerns? No  Items Reviewed: Did the pt receive and understand the discharge instructions provided? Yes  Medications obtained and verified?  No medication prescribed. Other? No  Any new allergies since your discharge? No  Dietary orders reviewed? Yes Do you have support at home? Yes   Follow up appointments reviewed:  PCP Hospital f/u appt confirmed? No  Scheduled to see  on  @ . Specialist Hospital f/u appt confirmed? No  Scheduled to see  on  @ . Are transportation arrangements needed? No  If their condition worsens, is the pt aware to call PCP or go to the Emergency Dept.? Yes Was the patient provided with contact information for the PCP's office or ED? Yes Was to pt encouraged to call back with questions or concerns? Yes  Denorris Reust [facility]  [facility] Population Health Community Resource Care Guide   [email or web address]  ?? [phone]   Website: [email or web address]  [email or web address]

## 2022-11-11 ENCOUNTER — Other Ambulatory Visit: Payer: Self-pay | Admitting: Internal Medicine

## 2022-11-11 DIAGNOSIS — Z1231 Encounter for screening mammogram for malignant neoplasm of breast: Secondary | ICD-10-CM

## 2022-12-03 ENCOUNTER — Ambulatory Visit
Admission: RE | Admit: 2022-12-03 | Discharge: 2022-12-03 | Disposition: A | Discharge status: Home / Self Care | Payer: PPO | Source: Ambulatory Visit | Attending: Internal Medicine | Admitting: Internal Medicine

## 2022-12-03 DIAGNOSIS — Z1231 Encounter for screening mammogram for malignant neoplasm of breast: Secondary | ICD-10-CM

## 2022-12-30 DIAGNOSIS — N3281 Overactive bladder: Secondary | ICD-10-CM | POA: Diagnosis not present

## 2022-12-30 DIAGNOSIS — Z853 Personal history of malignant neoplasm of breast: Secondary | ICD-10-CM | POA: Diagnosis not present

## 2022-12-30 DIAGNOSIS — K5900 Constipation, unspecified: Secondary | ICD-10-CM | POA: Diagnosis not present

## 2022-12-30 DIAGNOSIS — D126 Benign neoplasm of colon, unspecified: Secondary | ICD-10-CM | POA: Diagnosis not present

## 2022-12-30 DIAGNOSIS — M8580 Other specified disorders of bone density and structure, unspecified site: Secondary | ICD-10-CM | POA: Diagnosis not present

## 2022-12-30 DIAGNOSIS — M51369 Other intervertebral disc degeneration, lumbar region without mention of lumbar back pain or lower extremity pain: Secondary | ICD-10-CM | POA: Diagnosis not present

## 2022-12-30 DIAGNOSIS — M79621 Pain in right upper arm: Secondary | ICD-10-CM | POA: Diagnosis not present

## 2023-01-06 DIAGNOSIS — N8110 Cystocele, unspecified: Secondary | ICD-10-CM | POA: Diagnosis not present

## 2023-01-06 DIAGNOSIS — N3946 Mixed incontinence: Secondary | ICD-10-CM | POA: Diagnosis not present

## 2023-01-06 DIAGNOSIS — Z01419 Encounter for gynecological examination (general) (routine) without abnormal findings: Secondary | ICD-10-CM | POA: Diagnosis not present

## 2023-01-06 DIAGNOSIS — Z6826 Body mass index (BMI) 26.0-26.9, adult: Secondary | ICD-10-CM | POA: Diagnosis not present

## 2023-02-27 DIAGNOSIS — D225 Melanocytic nevi of trunk: Secondary | ICD-10-CM | POA: Diagnosis not present

## 2023-02-27 DIAGNOSIS — Z85828 Personal history of other malignant neoplasm of skin: Secondary | ICD-10-CM | POA: Diagnosis not present

## 2023-02-27 DIAGNOSIS — L308 Other specified dermatitis: Secondary | ICD-10-CM | POA: Diagnosis not present

## 2023-02-27 DIAGNOSIS — L821 Other seborrheic keratosis: Secondary | ICD-10-CM | POA: Diagnosis not present

## 2023-02-27 DIAGNOSIS — L570 Actinic keratosis: Secondary | ICD-10-CM | POA: Diagnosis not present

## 2023-04-14 DIAGNOSIS — L918 Other hypertrophic disorders of the skin: Secondary | ICD-10-CM | POA: Diagnosis not present

## 2023-04-14 DIAGNOSIS — Z85828 Personal history of other malignant neoplasm of skin: Secondary | ICD-10-CM | POA: Diagnosis not present

## 2023-04-14 DIAGNOSIS — L810 Postinflammatory hyperpigmentation: Secondary | ICD-10-CM | POA: Diagnosis not present

## 2023-04-14 DIAGNOSIS — L814 Other melanin hyperpigmentation: Secondary | ICD-10-CM | POA: Diagnosis not present

## 2023-04-14 DIAGNOSIS — L570 Actinic keratosis: Secondary | ICD-10-CM | POA: Diagnosis not present

## 2023-04-14 DIAGNOSIS — D2272 Melanocytic nevi of left lower limb, including hip: Secondary | ICD-10-CM | POA: Diagnosis not present

## 2023-04-14 DIAGNOSIS — L821 Other seborrheic keratosis: Secondary | ICD-10-CM | POA: Diagnosis not present

## 2023-04-14 DIAGNOSIS — L308 Other specified dermatitis: Secondary | ICD-10-CM | POA: Diagnosis not present

## 2023-05-02 ENCOUNTER — Other Ambulatory Visit: Payer: Self-pay | Admitting: Hematology and Oncology

## 2023-05-26 DIAGNOSIS — Z0000 Encounter for general adult medical examination without abnormal findings: Secondary | ICD-10-CM | POA: Diagnosis not present

## 2023-05-26 DIAGNOSIS — R7989 Other specified abnormal findings of blood chemistry: Secondary | ICD-10-CM | POA: Diagnosis not present

## 2023-05-26 DIAGNOSIS — H43813 Vitreous degeneration, bilateral: Secondary | ICD-10-CM | POA: Diagnosis not present

## 2023-05-26 DIAGNOSIS — H43812 Vitreous degeneration, left eye: Secondary | ICD-10-CM | POA: Diagnosis not present

## 2023-05-26 DIAGNOSIS — H25813 Combined forms of age-related cataract, bilateral: Secondary | ICD-10-CM | POA: Diagnosis not present

## 2023-05-26 DIAGNOSIS — H43811 Vitreous degeneration, right eye: Secondary | ICD-10-CM | POA: Diagnosis not present

## 2023-06-01 DIAGNOSIS — Z1212 Encounter for screening for malignant neoplasm of rectum: Secondary | ICD-10-CM | POA: Diagnosis not present

## 2023-06-02 DIAGNOSIS — K5900 Constipation, unspecified: Secondary | ICD-10-CM | POA: Diagnosis not present

## 2023-06-02 DIAGNOSIS — Z170 Estrogen receptor positive status [ER+]: Secondary | ICD-10-CM | POA: Diagnosis not present

## 2023-06-02 DIAGNOSIS — M8580 Other specified disorders of bone density and structure, unspecified site: Secondary | ICD-10-CM | POA: Diagnosis not present

## 2023-06-02 DIAGNOSIS — M79621 Pain in right upper arm: Secondary | ICD-10-CM | POA: Diagnosis not present

## 2023-06-02 DIAGNOSIS — C50912 Malignant neoplasm of unspecified site of left female breast: Secondary | ICD-10-CM | POA: Diagnosis not present

## 2023-06-02 DIAGNOSIS — R82998 Other abnormal findings in urine: Secondary | ICD-10-CM | POA: Diagnosis not present

## 2023-06-02 DIAGNOSIS — Z0000 Encounter for general adult medical examination without abnormal findings: Secondary | ICD-10-CM | POA: Diagnosis not present

## 2023-06-02 DIAGNOSIS — N3281 Overactive bladder: Secondary | ICD-10-CM | POA: Diagnosis not present

## 2023-06-02 DIAGNOSIS — Z1331 Encounter for screening for depression: Secondary | ICD-10-CM | POA: Diagnosis not present

## 2023-06-02 DIAGNOSIS — C50911 Malignant neoplasm of unspecified site of right female breast: Secondary | ICD-10-CM | POA: Diagnosis not present

## 2023-06-02 DIAGNOSIS — M51362 Other intervertebral disc degeneration, lumbar region with discogenic back pain and lower extremity pain: Secondary | ICD-10-CM | POA: Diagnosis not present

## 2023-06-02 DIAGNOSIS — D126 Benign neoplasm of colon, unspecified: Secondary | ICD-10-CM | POA: Diagnosis not present

## 2023-06-02 DIAGNOSIS — Z1339 Encounter for screening examination for other mental health and behavioral disorders: Secondary | ICD-10-CM | POA: Diagnosis not present

## 2023-06-09 ENCOUNTER — Inpatient Hospital Stay: Attending: Adult Health | Admitting: Adult Health

## 2023-06-09 ENCOUNTER — Encounter: Payer: Self-pay | Admitting: Adult Health

## 2023-06-09 ENCOUNTER — Telehealth: Payer: Self-pay

## 2023-06-09 VITALS — BP 137/62 | HR 67 | Temp 97.7°F | Resp 17 | Wt 146.4 lb

## 2023-06-09 DIAGNOSIS — Z170 Estrogen receptor positive status [ER+]: Secondary | ICD-10-CM | POA: Insufficient documentation

## 2023-06-09 DIAGNOSIS — Z1721 Progesterone receptor positive status: Secondary | ICD-10-CM | POA: Diagnosis not present

## 2023-06-09 DIAGNOSIS — Z1732 Human epidermal growth factor receptor 2 negative status: Secondary | ICD-10-CM | POA: Diagnosis not present

## 2023-06-09 DIAGNOSIS — Z923 Personal history of irradiation: Secondary | ICD-10-CM | POA: Insufficient documentation

## 2023-06-09 DIAGNOSIS — C50211 Malignant neoplasm of upper-inner quadrant of right female breast: Secondary | ICD-10-CM | POA: Diagnosis not present

## 2023-06-09 DIAGNOSIS — Z79811 Long term (current) use of aromatase inhibitors: Secondary | ICD-10-CM | POA: Insufficient documentation

## 2023-06-09 NOTE — Telephone Encounter (Signed)
 Pt called and reports she has a new red lump to her lumpex incision site. She states she noticed it about a week ago. She recently started 5-FU cream for skin cancer prevention per dermatology and states since using it she notices it has tripled in size. She was offered [employer] visit at [facility] with out breast onc NP for further eval. She accepted and will come in today at 1140.

## 2023-06-09 NOTE — Progress Notes (Signed)
 [facility] Cancer Follow up:    Ariel Robins, MD [address] [place] [address]   DIAGNOSIS: Cancer Staging  Malignant neoplasm of upper-inner quadrant of right breast in female, estrogen receptor positive (HCC) Staging form: Breast, AJCC 8th Edition - Clinical stage from 11/18/2018: Stage IB (cT2, cN0, cM0, G2, ER+, PR+, HER2-) - Signed by Cameron Cea, MD on 11/18/2018 Histologic grading system: 3 grade system - Pathologic stage from 12/31/2018: Stage IA (pT2, pN0, cM0, G2, ER+, PR+, HER2-) - Signed by Cameron Cea, MD on 12/31/2018 Multigene prognostic tests performed: Oncotype DX Histologic grading system: 3 grade system    SUMMARY OF ONCOLOGIC HISTORY: Oncology History  Malignant neoplasm of upper-inner quadrant of right breast in female, estrogen receptor positive (HCC)  11/13/2018 Initial Diagnosis   Routine screening mammogram detected a right breast mass, 2.6cm, at the 1 o'clock position. Biopsy showed IDC with DCIS, grade 2, HER-2 - (1+), ER+ 95%, PR+ 95%, Ki67 15%.   11/18/2018 Cancer Staging   Staging form: Breast, AJCC 8th Edition - Clinical stage from 11/18/2018: Stage IB (cT2, cN0, cM0, G2, ER+, PR+, HER2-)    11/25/2018 Genetic Testing   Negative genetic testing on the [facility] Breast Cancer STAT panel and the Common Hereditary Cancers panel. The report date is 11/25/2018.  The STAT Breast cancer panel offered by [facility] includes sequencing and rearrangement analysis for the following 9 genes:  ATM, BRCA1, BRCA2, CDH1, CHEK2, PALB2, PTEN, STK11 and TP53.  The Common Hereditary Cancers Panel offered by [facility] includes sequencing and/or deletion duplication testing of the following 48 genes: APC, ATM, AXIN2, BARD1, BMPR1A, BRCA1, BRCA2, BRIP1, CDH1, CDK4, CDKN2A (p14ARF), CDKN2A (p16INK4a), CHEK2, CTNNA1, DICER1, EPCAM (Deletion/duplication testing only), GREM1 (promoter region deletion/duplication testing only), KIT, MEN1, MLH1, MSH2, MSH3, MSH6,  MUTYH, NBN, NF1, NHTL1, PALB2, PDGFRA, PMS2, POLD1, POLE, PTEN, RAD50, RAD51C, RAD51D, RNF43, SDHB, SDHC, SDHD, SMAD4, SMARCA4. STK11, TP53, TSC1, TSC2, and VHL.  The following genes were evaluated for sequence changes only: SDHA and HOXB13 c.251G>A variant only.    12/24/2018 Surgery   Right lumpectomy [place]) [phone]): IDC with DCIS, 3.1cm, grade 2, clear margins, 3 right axillary lymph nodes negative.  ER 95%, PR 95%, Ki-67 15%, HER-2 negative   12/24/2018 Oncotype testing   Oncotype: score 10, 3% chance of distant recurrence in 9 years with tamoxifen alone.    12/31/2018 Cancer Staging   Staging form: Breast, AJCC 8th Edition - Pathologic stage from 12/31/2018: Stage IA (pT2, pN0, cM0, G2, ER+, PR+, HER2-)    01/22/2019 - 02/18/2019 Radiation Therapy   The patient initially received a dose of 40.05 Gy in 15 fractions to the breast using whole-breast tangent fields. This was delivered using a 3-D conformal technique. The pt received a boost delivering an additional 10 Gy in 5 fractions using a electron boost with electrons. The total dose was 50.05 Gy.    02/2019 - 02/2024 Anti-estrogen oral therapy   Anastrozole      CURRENT THERAPY:  INTERVAL HISTORY:  Discussed the use of AI scribe software for clinical note transcription with the patient, who gave verbal consent to proceed.  Ariel Johnson 73 y.o. female returns for    Patient Active Problem List   Diagnosis Date Noted   Genetic testing 11/25/2018   History of cancer of left breast    Malignant neoplasm of upper-inner quadrant of right breast in female, estrogen receptor positive (HCC) 11/13/2018    is allergic to elemental sulfur  and sulfamethoxazole.  MEDICAL  HISTORY: Past Medical History:  Diagnosis Date   Breast cancer (HCC) 1998   Left Breast Cancer   Breast cancer (HCC) 2020   Right Breast Cancer   Ductal carcinoma in situ (DCIS) of left breast    GERD (gastroesophageal reflux disease)     Hepatitis    History of cancer of left breast    History of radiation therapy 01/21/19- 02/18/19   Right Breast 15 fx of 2.67 Gy each to total 40.05 Gy. Right Breast boost 5 fx of 2 Gy each to total 10 Gy    SURGICAL HISTORY: Past Surgical History:  Procedure Laterality Date   BREAST BIOPSY Right    BREAST BIOPSY Right    BREAST LUMPECTOMY Right 12/24/2018   BREAST LUMPECTOMY WITH RADIOACTIVE SEED AND SENTINEL LYMPH NODE BIOPSY Right 12/24/2018   Procedure: RIGHT BREAST LUMPECTOMY WITH RADIOACTIVE SEED AND RIGHT AXILLARY SENTINEL LYMPH NODE BIOPSY;  Surgeon: Juanita Norlander, MD;  Location: [facility];  Service: General;  Laterality: Right;   CHOLECYSTECTOMY     MASTECTOMY Left 1998   PARTIAL COLECTOMY     TUBAL LIGATION      SOCIAL HISTORY: Social History   Socioeconomic History   Marital status: Married    Spouse name: Not on file   Number of children: Not on file   Years of education: Not on file   Highest education level: Not on file  Occupational History   Not on file  Tobacco Use   Smoking status: Never   Smokeless tobacco: Never  Substance and Sexual Activity   Alcohol use: Not Currently   Drug use: Never   Sexual activity: Not on file  Other Topics Concern   Not on file  Social History Narrative   Not on file   Social Drivers of Health   Financial Resource Strain: Not on file  Food Insecurity: Low Risk  (08/06/2022)   Received from [facility]   Hunger Vital Sign    Worried About Running Out of Food in the Last Year: Never true    Ran Out of Food in the Last Year: Never true  Transportation Needs: Not on file (08/06/2022)  Physical Activity: Not on file  Stress: Not on file  Social Connections: Not on file  Intimate Partner Violence: Not At Risk (01/12/2019)   Humiliation, Afraid, Rape, and Kick questionnaire    Fear of Current or Ex-Partner: No    Emotionally Abused: No    Physically Abused: No    Sexually Abused: No    FAMILY  HISTORY: Family History  Problem Relation Age of Onset   Colon cancer Father 64       possible colon cancer   Heart disease Maternal Grandmother    Heart disease Maternal Grandfather    Arthritis Paternal Grandmother     Review of Systems - Oncology    PHYSICAL EXAMINATION    There were no vitals filed for this visit.  Physical Exam  LABORATORY DATA:  CBC    Component Value Date/Time   WBC 4.7 10/12/2022 0108   RBC 3.92 10/12/2022 0108   HGB 11.5 (L) 10/12/2022 0108   HGB 12.9 11/18/2018 0824   HCT 35.2 (L) 10/12/2022 0108   PLT 158 10/12/2022 0108   PLT 165 11/18/2018 0824   MCV 89.8 10/12/2022 0108   MCH 29.3 10/12/2022 0108   MCHC 32.7 10/12/2022 0108   RDW 13.4 10/12/2022 0108   LYMPHSABS 1.9 10/12/2022 0108   MONOABS 0.6 10/12/2022 0108  EOSABS 0.1 10/12/2022 0108   BASOSABS 0.0 10/12/2022 0108    CMP     Component Value Date/Time   NA 138 10/12/2022 0108   K 3.2 (L) 10/12/2022 0108   CL 104 10/12/2022 0108   CO2 21 (L) 10/12/2022 0108   GLUCOSE 134 (H) 10/12/2022 0108   BUN 13 10/12/2022 0108   CREATININE 0.86 10/12/2022 0108   CREATININE 0.84 11/18/2018 0824   CALCIUM 9.0 10/12/2022 0108   PROT 6.3 (L) 10/12/2022 0108   ALBUMIN 3.6 10/12/2022 0108   AST 24 10/12/2022 0108   AST 21 11/18/2018 0824   ALT 19 10/12/2022 0108   ALT 18 11/18/2018 0824   ALKPHOS 74 10/12/2022 0108   BILITOT 0.4 10/12/2022 0108   BILITOT 0.7 11/18/2018 0824   GFRNONAA >60 10/12/2022 0108   GFRNONAA >60 11/18/2018 0824   GFRAA >60 11/18/2018 0824     ASSESSMENT and THERAPY PLAN:   No problem-specific Assessment & Plan notes found for this encounter.     All questions were answered. The patient knows to call the clinic with any problems, questions or concerns. We can certainly see the patient much sooner if necessary.  Total encounter time:*** minutes*in face-to-face visit time, chart review, lab review, care coordination, order entry, and documentation of  the encounter time.    Alwin Baars, NP 06/09/23 11:48 AM Medical Oncology and Hematology [facility] [address] [place], [place] [address] Tel. [phone]    Fax. [phone]  *Total Encounter Time as defined by the Centers for Medicare and Medicaid Services includes, in addition to the face-to-face time of a patient visit (documented in the note above) non-face-to-face time: obtaining and reviewing outside history, ordering and reviewing medications, tests or procedures, care coordination (communications with other health care professionals or caregivers) and documentation in the medical record.

## 2023-06-09 NOTE — Assessment & Plan Note (Signed)
11/13/2018:Routine screening mammogram detected a right breast mass, 2.6cm, at the 1 o'clock position. Biopsy showed IDC with DCIS, grade 2, HER-2 - (1+), ER+ 95%, PR+ 95%, Ki67 15%. T2N0 stage Ib Prior history of left mastectomy 1998  Treatment plan: 1. 12/24/2018:Right lumpectomy Ezzard Standing): IDC with DCIS, 3.1cm, grade 2, clear margins, 3 right axillary lymph nodes negative.  T2 N0 stage Ib 2. Oncotype: score 10, 3% chance of distant recurrence in 9 years with tamoxifen alone.  3. Adjuvant radiation therapy 01/22/2019 4. Adjuvant antiestrogen therapy with anastrozole 1 mg daily x5 to 7 years --------------------------------------------------------------------------------------------------------------------------- Treatment plan: Adjuvant antiestrogen therapy with anastrozole started January 15th 2021 Anastrozole toxicities: Denies any major side effects.  She does have occasional hot flashes which are manageable. Occasional muscle cramps but no muscle stiffness or achiness. She had a pinched nerve in the back which she received an injection and is doing much better. Squamous cell skin cancer diagnosis: She is getting surgery November.   Breast cancer surveillance: 1.  Mammogram 11/28/2021 right breast: Benign breast density category B 2. breast exam 08/30/2022: Benign 3.  Bone density 11/27/2021: T-score -2: Osteopenia: Recommended calcium and vitamin D   She traveled to [place] and [place] recently and came back last Friday. Return to clinic in 1 year for follow up

## 2023-06-13 ENCOUNTER — Other Ambulatory Visit: Payer: Self-pay | Admitting: Surgery

## 2023-06-13 DIAGNOSIS — Z853 Personal history of malignant neoplasm of breast: Secondary | ICD-10-CM

## 2023-06-13 DIAGNOSIS — Z170 Estrogen receptor positive status [ER+]: Secondary | ICD-10-CM | POA: Diagnosis not present

## 2023-06-13 DIAGNOSIS — C50211 Malignant neoplasm of upper-inner quadrant of right female breast: Secondary | ICD-10-CM | POA: Diagnosis not present

## 2023-06-17 ENCOUNTER — Ambulatory Visit
Admission: RE | Admit: 2023-06-17 | Discharge: 2023-06-17 | Disposition: A | Discharge status: Home / Self Care | Source: Ambulatory Visit | Attending: Surgery | Admitting: Surgery

## 2023-06-17 ENCOUNTER — Other Ambulatory Visit: Payer: Self-pay | Admitting: Surgery

## 2023-06-17 DIAGNOSIS — Z853 Personal history of malignant neoplasm of breast: Secondary | ICD-10-CM

## 2023-06-17 DIAGNOSIS — N641 Fat necrosis of breast: Secondary | ICD-10-CM | POA: Diagnosis not present

## 2023-06-17 DIAGNOSIS — N6459 Other signs and symptoms in breast: Secondary | ICD-10-CM | POA: Diagnosis not present

## 2023-06-20 ENCOUNTER — Ambulatory Visit: Payer: Self-pay | Admitting: Surgery

## 2023-06-20 ENCOUNTER — Other Ambulatory Visit: Payer: Self-pay

## 2023-06-20 ENCOUNTER — Encounter (HOSPITAL_BASED_OUTPATIENT_CLINIC_OR_DEPARTMENT_OTHER): Payer: Self-pay | Admitting: Surgery

## 2023-06-20 NOTE — H&P (Signed)
 Subjective    Chief Complaint: New Consultation (Rt br lump )       History of Present Illness: Ariel Johnson is a 73 y.o. female who is seen today as an office consultation at the request of Dr. Gudena for evaluation of New Consultation (Rt br lump ) .     This is a former patient of Dr. Juanita Norlander PCP Dr. Thor Fling   Right breast cancer Biopsy 11/11/2018-right breast 1:00-IDC, grade 2, ER PR positive, HER2 negative, Ki-67 15% Right breast lumpectomy and sentinel lymph node biopsy 12/24/18-3.1 cm IDC, margins clear, 0/2 lymph nodes Oncotype-10 Radiation therapy December 2020   Left breast cancer-DCIS-1998 [facility] mastectomy, Francina Irish reconstruction with TRAM flap 2015 negative genetics   Over the last several weeks, the patient has developed a small area of erythema and a palpable lump just above her right lumpectomy incision.  This was mildly tender initially.  The lump has not enlarged significantly since first noticed.  Her last mammogram was in October 2024 and was unremarkable.     Review of Systems: A complete review of systems was obtained from the patient.  I have reviewed this information and discussed as appropriate with the patient.  See HPI as well for other ROS.   Review of Systems  Constitutional: Negative.   HENT: Negative.    Eyes: Negative.   Respiratory: Negative.    Cardiovascular: Negative.   Gastrointestinal: Negative.   Genitourinary: Negative.   Musculoskeletal: Negative.   Skin: Negative.   Neurological: Negative.   Endo/Heme/Allergies: Negative.   Psychiatric/Behavioral: Negative.          Medical History: Past Medical History      Past Medical History:  Diagnosis Date   DVT (deep venous thrombosis) (CMS/HHS-HCC)     GERD (gastroesophageal reflux disease)     Hepatitis     History of cancer          Problem List     Patient Active Problem List  Diagnosis   Malignant neoplasm of upper-inner quadrant of right breast in female,  estrogen receptor positive (CMS/HHS-HCC)        Past Surgical History       Past Surgical History:  Procedure Laterality Date   CHOLECYSTECTOMY       ESSURE TUBAL LIGATION Bilateral     MASTECTOMY       ROBOTIC COLECTOMY PARTIAL            Allergies      Allergies  Allergen Reactions   Sulfamethoxazole Hives   Sulfur  (Not Sulfa) Itching        Medications Ordered Prior to Encounter        Current Outpatient Medications on File Prior to Visit  Medication Sig Dispense Refill   anastrozole  (ARIMIDEX ) 1 mg tablet Take 1 tablet by mouth once daily       calcium carbonate-vitamin D3 (CALTRATE 600 PLUS D) 600 mg-20 mcg (800 unit) Chew 1 tablet with a meal Orally Once a day       docusate (COLACE) 250 MG capsule Take 250 mg by mouth once daily       famotidine  (PEPCID ) 20 MG tablet 1 tablet at bedtime as needed Orally Once a day       GEMTESA 75 mg Tab Take 1 tablet by mouth once daily       multivitamin tablet Take 1 tablet by mouth once daily       MYRBETRIQ  25 mg ER Tablet Take 1 tablet by mouth  once daily       Saccharomyces boulardii (FLORASTOR) 250 mg capsule Take 250 mg by mouth 2 (two) times daily       tretinoin (RETIN-A) 0.05 % cream          No current facility-administered medications on file prior to visit.        Family History       Family History  Problem Relation Age of Onset   High blood pressure (Hypertension) Father     Hyperlipidemia (Elevated cholesterol) Father     Coronary Artery Disease (Blocked arteries around heart) Father     Diabetes Father     Deep vein thrombosis (DVT or abnormal blood clot formation) Father     Colon cancer Father     Connective Tissue Disorder Maternal Grandmother     Connective Tissue Disorder Maternal Grandfather          Tobacco Use History  Social History       Tobacco Use  Smoking Status Never  Smokeless Tobacco Never        Social History  Social History        Socioeconomic History   Marital  status: Married  Tobacco Use   Smoking status: Never   Smokeless tobacco: Never  Vaping Use   Vaping status: Never Used  Substance and Sexual Activity   Alcohol use: Yes   Drug use: Never    Social Drivers of Metallurgist Insecurity: Low Risk  (08/06/2022)    Received from [facility]    Hunger Vital Sign     Worried About Running Out of Food in the Last Year: Never true     Ran Out of Food in the Last Year: Never true  Transportation Needs: No Transportation Needs (08/06/2022)    Received from [employer]     In the past 12 months, has lack of reliable transportation kept you from medical appointments, meetings, work or from getting things needed for daily living? : No  Housing Stability: Unknown (06/13/2023)    Housing Stability Vital Sign     Homeless in the Last Year: No        Objective:         Vitals:    06/13/23 1031  BP: (!) 162/79  Pulse: 77  Temp: 36.7 C (98 F)  SpO2: 99%  Weight: 66.5 kg (146 lb 9.6 oz)  Height: 162.6 cm (5\' 4" )  PainSc: 0-No pain  PainLoc: Breast    Body mass index is 25.16 kg/m.   Physical Exam    Constitutional:  WDWN in NAD, conversant, no obvious deformities; lying in bed comfortably Eyes:  Pupils equal, round; sclera anicteric; moist conjunctiva; no lid lag HENT:  Oral mucosa moist; good dentition  Neck:  No masses palpated, trachea midline; no thyromegaly Lungs:  CTA bilaterally; normal respiratory effort Breasts:  left breast s/p mastectomy with TRAM flap reconstruction.  No palpable subcutaneous masses   Right breast - healed right upper inner quadrant lumpectomy incision.  Just above this scar is a 1 cm area of erythema. There seems to be a 2 cm palpable mass involving the erythematous area and the adjacent scar.  This mass is fixed to the surrounding tissue.  No lymphadenopathy.  No other palpable breast masses CV:  Regular rate and rhythm; no murmurs; extremities well-perfused with no  edema Abd:  +bowel sounds, soft, non-tender, no palpable organomegaly; no palpable hernias  Musc:  Unable to assess gait; no apparent clubbing or cyanosis in extremities Lymphatic:  No palpable cervical or axillary lymphadenopathy Skin:  Warm, dry; no sign of jaundice Psychiatric - alert and oriented x 4; calm mood and affect     Labs, Imaging and Diagnostic Testing: CLINICAL DATA:  73 year old female presenting for a new area of redness on the right breast at her lumpectomy site. Patient also reports an intermittent itchy rash on the left breast which comes and goes over time. The left breast rash is improved with only a tiny dot of redness remaining. History of right breast cancer status post lumpectomy in 2020. History of left breast cancer status post mastectomy in 1998.   EXAM: DIGITAL DIAGNOSTIC UNILATERAL RIGHT MAMMOGRAM WITH TOMOSYNTHESIS AND CAD; ULTRASOUND RIGHT BREAST LIMITED; ULTRASOUND LEFT BREAST LIMITED   TECHNIQUE: Right digital diagnostic mammography and breast tomosynthesis was performed. The images were evaluated with computer-aided detection. ; Targeted ultrasound examination of the right breast was performed; Targeted ultrasound examination of the left breast was performed.   COMPARISON:  Previous exam(s).   ACR Breast Density Category b: There are scattered areas of fibroglandular density.   FINDINGS: Mammogram:   Right breast: A skin BB marks the palpable site of concern reported by the patient at the lumpectomy site in the right breast. A spot tangential view of this area was performed in addition to standard views. There is no definite new abnormality at the palpable site or elsewhere in the right breast suggest presence of malignancy. At the lumpectomy site there is evolving fat necrosis with an oil cyst.   On physical exam of the right breast at the site of concern reported by the patient there is a small red flat lesion on the skin at  the lumpectomy site. On the upper left breast at the site of concern reported patient there is a tiny raised red dot.   Ultrasound:   Right breast: Targeted ultrasound performed at the palpable site of concern in the right breast at 1 o'clock 5 cm from the nipple demonstrating benign fat necrosis with an oil cyst measuring 1.1 x 0.7 x 0.8 cm which extends up to the skin. There is no suspicious solid mass.   Left breast: Targeted ultrasound is performed in the superior aspect of the left reconstructed left breast at the site of concern reported by the patient demonstrating no cystic or solid mass. No changes noted within the skin.   IMPRESSION: 1. Evolving benign superficial fat necrosis at the site of concern in the right breast at the patient's lumpectomy site which may be causing some skin irritation.   2. No mammographic or sonographic evidence of malignancy at the site of intermittent rash reported by the patient in the reconstructed upper left breast.   RECOMMENDATION: 1. Clinical follow-up as needed for the right breast skin lesion. This is felt to be irritation due to underlying fat necrosis. If the lesion increases in size or does not resolve in time may consider skin punch biopsy.   2. Clinical follow-up as needed for intermittent rash in the superior portion of the reconstructed left breast.   3.  Screening mammogram in one year.(Code:SM-B-01Y)   I have discussed the findings and recommendations with the patient. If applicable, a reminder letter will be sent to the patient regarding the next appointment.   BI-RADS CATEGORY  2: Benign.     Electronically Signed   By: Allena Ito M.D.   On: 06/17/2023 15:20  CLINICAL DATA:  Screening.   EXAM: DIGITAL SCREENING UNILATERAL RIGHT MAMMOGRAM WITH CAD AND TOMOSYNTHESIS   TECHNIQUE: Right screening digital craniocaudal and mediolateral oblique mammograms were obtained. Right screening digital  breast tomosynthesis was performed. The images were evaluated with computer-aided detection.   COMPARISON:  Previous exam(s).   ACR Breast Density Category b: There are scattered areas of fibroglandular density.   FINDINGS: The patient has had a left mastectomy. There are no findings suspicious for malignancy. RIGHT surgical changes again noted.   IMPRESSION: No mammographic evidence of malignancy. A result letter of this screening mammogram will be mailed directly to the patient.   RECOMMENDATION: Screening mammogram in one year.  (Code:SM-R-62M)   BI-RADS CATEGORY  2: Benign.     Electronically Signed   By: Sundra Engel M.D.   On: 12/05/2022 13:48   Assessment and Plan:  Diagnoses and all orders for this visit:   Malignant neoplasm of upper-inner quadrant of right breast in female, estrogen receptor positive (CMS/HHS-HCC)     This erythema and palpable mass are concerning for a recurrence of her breast cancer.  Imaging on 06/17/23 does not show evidence of recurrent malignancy, but this palpable area likely represents underlying fat necrosis.  However, the patient that the area has enlarged slightly in the last week.  She would like to have this area excised for definitive treatment and diagnosis.  We will plan right breast excisional biopsy.  She has a trip planned in two weeks, so we will try to get this scheduled urgently for next week.  The surgical procedure has been discussed with the patient.  Potential risks, benefits, alternative treatments, and expected outcomes have been explained.  All of the patient's questions at this time have been answered.  The likelihood of reaching the patient's treatment goal is good.  The patient understand the proposed surgical procedure and wishes to proceed.  Kari Otto. Eli Grizzle, MD, [facility] Surgery  General Surgery   06/20/2023 11:56 AM

## 2023-06-20 NOTE — H&P (View-Only) (Signed)
 Subjective    Chief Complaint: New Consultation (Rt br lump )       History of Present Illness: Ariel Johnson is a 73 y.o. female who is seen today as an office consultation at the request of Dr. Gudena for evaluation of New Consultation (Rt br lump ) .     This is a former patient of Dr. Juanita Norlander PCP Dr. Thor Fling   Right breast cancer Biopsy 11/11/2018-right breast 1:00-IDC, grade 2, ER PR positive, HER2 negative, Ki-67 15% Right breast lumpectomy and sentinel lymph node biopsy 12/24/18-3.1 cm IDC, margins clear, 0/2 lymph nodes Oncotype-10 Radiation therapy December 2020   Left breast cancer-DCIS-1998 [place] mastectomy, Francina Irish reconstruction with TRAM flap 2015 negative genetics   Over the last several weeks, the patient has developed a small area of erythema and a palpable lump just above her right lumpectomy incision.  This was mildly tender initially.  The lump has not enlarged significantly since first noticed.  Her last mammogram was in October 2024 and was unremarkable.     Review of Systems: A complete review of systems was obtained from the patient.  I have reviewed this information and discussed as appropriate with the patient.  See HPI as well for other ROS.   Review of Systems  Constitutional: Negative.   HENT: Negative.    Eyes: Negative.   Respiratory: Negative.    Cardiovascular: Negative.   Gastrointestinal: Negative.   Genitourinary: Negative.   Musculoskeletal: Negative.   Skin: Negative.   Neurological: Negative.   Endo/Heme/Allergies: Negative.   Psychiatric/Behavioral: Negative.          Medical History: Past Medical History      Past Medical History:  Diagnosis Date   DVT (deep venous thrombosis) (CMS/HHS-HCC)     GERD (gastroesophageal reflux disease)     Hepatitis     History of cancer          Problem List     Patient Active Problem List  Diagnosis   Malignant neoplasm of upper-inner quadrant of right breast in female,  estrogen receptor positive (CMS/HHS-HCC)        Past Surgical History       Past Surgical History:  Procedure Laterality Date   CHOLECYSTECTOMY       ESSURE TUBAL LIGATION Bilateral     MASTECTOMY       ROBOTIC COLECTOMY PARTIAL            Allergies      Allergies  Allergen Reactions   Sulfamethoxazole Hives   Sulfur  (Not Sulfa) Itching        Medications Ordered Prior to Encounter        Current Outpatient Medications on File Prior to Visit  Medication Sig Dispense Refill   anastrozole  (ARIMIDEX ) 1 mg tablet Take 1 tablet by mouth once daily       calcium carbonate-vitamin D3 (CALTRATE 600 PLUS D) 600 mg-20 mcg (800 unit) Chew 1 tablet with a meal Orally Once a day       docusate (COLACE) 250 MG capsule Take 250 mg by mouth once daily       famotidine  (PEPCID ) 20 MG tablet 1 tablet at bedtime as needed Orally Once a day       GEMTESA 75 mg Tab Take 1 tablet by mouth once daily       multivitamin tablet Take 1 tablet by mouth once daily       MYRBETRIQ  25 mg ER Tablet Take 1 tablet by mouth  once daily       Saccharomyces boulardii (FLORASTOR) 250 mg capsule Take 250 mg by mouth 2 (two) times daily       tretinoin (RETIN-A) 0.05 % cream          No current facility-administered medications on file prior to visit.        Family History       Family History  Problem Relation Age of Onset   High blood pressure (Hypertension) Father     Hyperlipidemia (Elevated cholesterol) Father     Coronary Artery Disease (Blocked arteries around heart) Father     Diabetes Father     Deep vein thrombosis (DVT or abnormal blood clot formation) Father     Colon cancer Father     Connective Tissue Disorder Maternal Grandmother     Connective Tissue Disorder Maternal Grandfather          Tobacco Use History  Social History       Tobacco Use  Smoking Status Never  Smokeless Tobacco Never        Social History  Social History        Socioeconomic History   Marital  status: Married  Tobacco Use   Smoking status: Never   Smokeless tobacco: Never  Vaping Use   Vaping status: Never Used  Substance and Sexual Activity   Alcohol use: Yes   Drug use: Never    Social Drivers of Metallurgist Insecurity: Low Risk  (08/06/2022)    Received from [facility]    Hunger Vital Sign     Worried About Running Out of Food in the Last Year: Never true     Ran Out of Food in the Last Year: Never true  Transportation Needs: No Transportation Needs (08/06/2022)    Received from [employer]     In the past 12 months, has lack of reliable transportation kept you from medical appointments, meetings, work or from getting things needed for daily living? : No  Housing Stability: Unknown (06/13/2023)    Housing Stability Vital Sign     Homeless in the Last Year: No        Objective:         Vitals:    06/13/23 1031  BP: (!) 162/79  Pulse: 77  Temp: 36.7 C (98 F)  SpO2: 99%  Weight: 66.5 kg (146 lb 9.6 oz)  Height: 162.6 cm (5\' 4" )  PainSc: 0-No pain  PainLoc: Breast    Body mass index is 25.16 kg/m.   Physical Exam    Constitutional:  WDWN in NAD, conversant, no obvious deformities; lying in bed comfortably Eyes:  Pupils equal, round; sclera anicteric; moist conjunctiva; no lid lag HENT:  Oral mucosa moist; good dentition  Neck:  No masses palpated, trachea midline; no thyromegaly Lungs:  CTA bilaterally; normal respiratory effort Breasts:  left breast s/p mastectomy with TRAM flap reconstruction.  No palpable subcutaneous masses   Right breast - healed right upper inner quadrant lumpectomy incision.  Just above this scar is a 1 cm area of erythema. There seems to be a 2 cm palpable mass involving the erythematous area and the adjacent scar.  This mass is fixed to the surrounding tissue.  No lymphadenopathy.  No other palpable breast masses CV:  Regular rate and rhythm; no murmurs; extremities well-perfused with no  edema Abd:  +bowel sounds, soft, non-tender, no palpable organomegaly; no palpable hernias  Musc:  Unable to assess gait; no apparent clubbing or cyanosis in extremities Lymphatic:  No palpable cervical or axillary lymphadenopathy Skin:  Warm, dry; no sign of jaundice Psychiatric - alert and oriented x 4; calm mood and affect     Labs, Imaging and Diagnostic Testing: CLINICAL DATA:  73 year old female presenting for a new area of redness on the right breast at her lumpectomy site. Patient also reports an intermittent itchy rash on the left breast which comes and goes over time. The left breast rash is improved with only a tiny dot of redness remaining. History of right breast cancer status post lumpectomy in 2020. History of left breast cancer status post mastectomy in 1998.   EXAM: DIGITAL DIAGNOSTIC UNILATERAL RIGHT MAMMOGRAM WITH TOMOSYNTHESIS AND CAD; ULTRASOUND RIGHT BREAST LIMITED; ULTRASOUND LEFT BREAST LIMITED   TECHNIQUE: Right digital diagnostic mammography and breast tomosynthesis was performed. The images were evaluated with computer-aided detection. ; Targeted ultrasound examination of the right breast was performed; Targeted ultrasound examination of the left breast was performed.   COMPARISON:  Previous exam(s).   ACR Breast Density Category b: There are scattered areas of fibroglandular density.   FINDINGS: Mammogram:   Right breast: A skin BB marks the palpable site of concern reported by the patient at the lumpectomy site in the right breast. A spot tangential view of this area was performed in addition to standard views. There is no definite new abnormality at the palpable site or elsewhere in the right breast suggest presence of malignancy. At the lumpectomy site there is evolving fat necrosis with an oil cyst.   On physical exam of the right breast at the site of concern reported by the patient there is a small red flat lesion on the skin at  the lumpectomy site. On the upper left breast at the site of concern reported patient there is a tiny raised red dot.   Ultrasound:   Right breast: Targeted ultrasound performed at the palpable site of concern in the right breast at 1 o'clock 5 cm from the nipple demonstrating benign fat necrosis with an oil cyst measuring 1.1 x 0.7 x 0.8 cm which extends up to the skin. There is no suspicious solid mass.   Left breast: Targeted ultrasound is performed in the superior aspect of the left reconstructed left breast at the site of concern reported by the patient demonstrating no cystic or solid mass. No changes noted within the skin.   IMPRESSION: 1. Evolving benign superficial fat necrosis at the site of concern in the right breast at the patient's lumpectomy site which may be causing some skin irritation.   2. No mammographic or sonographic evidence of malignancy at the site of intermittent rash reported by the patient in the reconstructed upper left breast.   RECOMMENDATION: 1. Clinical follow-up as needed for the right breast skin lesion. This is felt to be irritation due to underlying fat necrosis. If the lesion increases in size or does not resolve in time may consider skin punch biopsy.   2. Clinical follow-up as needed for intermittent rash in the superior portion of the reconstructed left breast.   3.  Screening mammogram in one year.(Code:SM-B-01Y)   I have discussed the findings and recommendations with the patient. If applicable, a reminder letter will be sent to the patient regarding the next appointment.   BI-RADS CATEGORY  2: Benign.     Electronically Signed   By: Allena Ito M.D.   On: 06/17/2023 15:20  CLINICAL DATA:  Screening.   EXAM: DIGITAL SCREENING UNILATERAL RIGHT MAMMOGRAM WITH CAD AND TOMOSYNTHESIS   TECHNIQUE: Right screening digital craniocaudal and mediolateral oblique mammograms were obtained. Right screening digital  breast tomosynthesis was performed. The images were evaluated with computer-aided detection.   COMPARISON:  Previous exam(s).   ACR Breast Density Category b: There are scattered areas of fibroglandular density.   FINDINGS: The patient has had a left mastectomy. There are no findings suspicious for malignancy. RIGHT surgical changes again noted.   IMPRESSION: No mammographic evidence of malignancy. A result letter of this screening mammogram will be mailed directly to the patient.   RECOMMENDATION: Screening mammogram in one year.  (Code:SM-R-62M)   BI-RADS CATEGORY  2: Benign.     Electronically Signed   By: Sundra Engel M.D.   On: 12/05/2022 13:48   Assessment and Plan:  Diagnoses and all orders for this visit:   Malignant neoplasm of upper-inner quadrant of right breast in female, estrogen receptor positive (CMS/HHS-HCC)     This erythema and palpable mass are concerning for a recurrence of her breast cancer.  Imaging on 06/17/23 does not show evidence of recurrent malignancy, but this palpable area likely represents underlying fat necrosis.  However, the patient that the area has enlarged slightly in the last week.  She would like to have this area excised for definitive treatment and diagnosis.  We will plan right breast excisional biopsy.  She has a trip planned in two weeks, so we will try to get this scheduled urgently for next week.  The surgical procedure has been discussed with the patient.  Potential risks, benefits, alternative treatments, and expected outcomes have been explained.  All of the patient's questions at this time have been answered.  The likelihood of reaching the patient's treatment goal is good.  The patient understand the proposed surgical procedure and wishes to proceed.  Kari Otto. Eli Grizzle, MD, [facility]  General Surgery   06/20/2023 11:56 AM

## 2023-06-22 NOTE — Anesthesia Preprocedure Evaluation (Signed)
 Anesthesia Evaluation  Patient identified by MRN, date of birth, ID band Patient awake    Reviewed: Allergy & Precautions, NPO status , Patient's Chart, lab work & pertinent test results  History of Anesthesia Complications Negative for: history of anesthetic complications  Airway Mallampati: II  TM Distance: >3 FB Neck ROM: Full    Dental no notable dental hx.    Pulmonary neg pulmonary ROS   Pulmonary exam normal        Cardiovascular negative cardio ROS Normal cardiovascular exam     Neuro/Psych negative neurological ROS  negative psych ROS   GI/Hepatic Neg liver ROS,GERD  Medicated and Controlled,,  Endo/Other  negative endocrine ROS    Renal/GU negative Renal ROS  negative genitourinary   Musculoskeletal negative musculoskeletal ROS (+)    Abdominal   Peds  Hematology negative hematology ROS (+)   Anesthesia Other Findings Hx of breast ca  Reproductive/Obstetrics negative OB ROS                              Anesthesia Physical Anesthesia Plan  ASA: 2  Anesthesia Plan: General   Post-op Pain Management: Tylenol  PO (pre-op)*   Induction: Intravenous  PONV Risk Score and Plan: 3 and Treatment may vary due to age or medical condition, Ondansetron  and Dexamethasone   Airway Management Planned: LMA  Additional Equipment: None  Intra-op Plan:   Post-operative Plan: Extubation in OR  Informed Consent: I have reviewed the patients History and Physical, chart, labs and discussed the procedure including the risks, benefits and alternatives for the proposed anesthesia with the patient or authorized representative who has indicated his/her understanding and acceptance.     Dental advisory given  Plan Discussed with: CRNA  Anesthesia Plan Comments:         Anesthesia Quick Evaluation

## 2023-06-23 ENCOUNTER — Other Ambulatory Visit: Payer: Self-pay

## 2023-06-23 ENCOUNTER — Ambulatory Visit (HOSPITAL_BASED_OUTPATIENT_CLINIC_OR_DEPARTMENT_OTHER)
Admission: RE | Admit: 2023-06-23 | Discharge: 2023-06-23 | Disposition: A | Discharge status: Home / Self Care | Attending: Surgery | Admitting: Surgery

## 2023-06-23 ENCOUNTER — Encounter (HOSPITAL_BASED_OUTPATIENT_CLINIC_OR_DEPARTMENT_OTHER)
Admission: RE | Disposition: A | Discharge status: Home / Self Care | Payer: Self-pay | Source: Home / Self Care | Attending: Surgery

## 2023-06-23 ENCOUNTER — Ambulatory Visit (HOSPITAL_BASED_OUTPATIENT_CLINIC_OR_DEPARTMENT_OTHER): Payer: Self-pay | Admitting: Anesthesiology

## 2023-06-23 ENCOUNTER — Encounter (HOSPITAL_BASED_OUTPATIENT_CLINIC_OR_DEPARTMENT_OTHER): Payer: Self-pay | Admitting: Surgery

## 2023-06-23 DIAGNOSIS — Z853 Personal history of malignant neoplasm of breast: Secondary | ICD-10-CM | POA: Insufficient documentation

## 2023-06-23 DIAGNOSIS — N641 Fat necrosis of breast: Secondary | ICD-10-CM | POA: Diagnosis not present

## 2023-06-23 DIAGNOSIS — Z9012 Acquired absence of left breast and nipple: Secondary | ICD-10-CM | POA: Insufficient documentation

## 2023-06-23 DIAGNOSIS — N6312 Unspecified lump in the right breast, upper inner quadrant: Secondary | ICD-10-CM | POA: Diagnosis not present

## 2023-06-23 DIAGNOSIS — Z923 Personal history of irradiation: Secondary | ICD-10-CM | POA: Insufficient documentation

## 2023-06-23 DIAGNOSIS — Z01818 Encounter for other preprocedural examination: Secondary | ICD-10-CM

## 2023-06-23 DIAGNOSIS — N6310 Unspecified lump in the right breast, unspecified quadrant: Secondary | ICD-10-CM | POA: Diagnosis not present

## 2023-06-23 DIAGNOSIS — K219 Gastro-esophageal reflux disease without esophagitis: Secondary | ICD-10-CM | POA: Diagnosis not present

## 2023-06-23 DIAGNOSIS — Z170 Estrogen receptor positive status [ER+]: Secondary | ICD-10-CM | POA: Diagnosis not present

## 2023-06-23 DIAGNOSIS — C50211 Malignant neoplasm of upper-inner quadrant of right female breast: Secondary | ICD-10-CM | POA: Diagnosis not present

## 2023-06-23 DIAGNOSIS — Z86000 Personal history of in-situ neoplasm of breast: Secondary | ICD-10-CM | POA: Diagnosis not present

## 2023-06-23 HISTORY — PX: BREAST CYST EXCISION: SHX579

## 2023-06-23 SURGERY — EXCISION, CYST, BREAST
Anesthesia: General | Site: Breast | Laterality: Right

## 2023-06-23 MED ORDER — EPHEDRINE 5 MG/ML INJ
INTRAVENOUS | Status: AC
  Filled 2023-06-23: qty 5

## 2023-06-23 MED ORDER — LACTATED RINGERS IV SOLN: INTRAVENOUS | Status: DC

## 2023-06-23 MED ORDER — FENTANYL CITRATE (PF) 100 MCG/2ML IJ SOLN
INTRAMUSCULAR | Status: AC
  Filled 2023-06-23: qty 2

## 2023-06-23 MED ORDER — LIDOCAINE HCL (CARDIAC) PF 100 MG/5ML IV SOSY
PREFILLED_SYRINGE | INTRAVENOUS | Status: DC | PRN
  Administered 2023-06-23: 60 mg via INTRAVENOUS

## 2023-06-23 MED ORDER — CEFAZOLIN SODIUM-DEXTROSE 2-4 GM/100ML-% IV SOLN
2.0000 g | INTRAVENOUS | Status: AC
  Administered 2023-06-23: 2 g via INTRAVENOUS

## 2023-06-23 MED ORDER — OXYCODONE HCL 5 MG PO TABS: 5.0000 mg | ORAL_TABLET | Freq: Once | ORAL | Status: DC | PRN

## 2023-06-23 MED ORDER — ONDANSETRON HCL 4 MG/2ML IJ SOLN
INTRAMUSCULAR | Status: DC | PRN
  Administered 2023-06-23: 4 mg via INTRAVENOUS

## 2023-06-23 MED ORDER — DEXAMETHASONE SODIUM PHOSPHATE 10 MG/ML IJ SOLN
INTRAMUSCULAR | Status: DC | PRN
  Administered 2023-06-23: 10 mg via INTRAVENOUS

## 2023-06-23 MED ORDER — CHLORHEXIDINE GLUCONATE CLOTH 2 % EX PADS: 6.0000 | MEDICATED_PAD | Freq: Once | CUTANEOUS | Status: DC

## 2023-06-23 MED ORDER — ACETAMINOPHEN 500 MG PO TABS
1000.0000 mg | ORAL_TABLET | Freq: Once | ORAL | Status: AC
  Administered 2023-06-23: 1000 mg via ORAL

## 2023-06-23 MED ORDER — ACETAMINOPHEN 500 MG PO TABS: 1000.0000 mg | ORAL_TABLET | ORAL | Status: DC

## 2023-06-23 MED ORDER — PROPOFOL 10 MG/ML IV BOLUS
INTRAVENOUS | Status: DC | PRN
  Administered 2023-06-23: 100 mg via INTRAVENOUS

## 2023-06-23 MED ORDER — FENTANYL CITRATE (PF) 100 MCG/2ML IJ SOLN: 25.0000 ug | INTRAMUSCULAR | Status: DC | PRN

## 2023-06-23 MED ORDER — BUPIVACAINE-EPINEPHRINE 0.25% -1:200000 IJ SOLN
INTRAMUSCULAR | Status: DC | PRN
  Administered 2023-06-23: 10 mL

## 2023-06-23 MED ORDER — DROPERIDOL 2.5 MG/ML IJ SOLN: 0.6250 mg | Freq: Once | INTRAMUSCULAR | Status: DC | PRN

## 2023-06-23 MED ORDER — DEXAMETHASONE SODIUM PHOSPHATE 10 MG/ML IJ SOLN
INTRAMUSCULAR | Status: AC
  Filled 2023-06-23: qty 1

## 2023-06-23 MED ORDER — LIDOCAINE 2% (20 MG/ML) 5 ML SYRINGE
INTRAMUSCULAR | Status: AC
  Filled 2023-06-23: qty 5

## 2023-06-23 MED ORDER — ONDANSETRON HCL 4 MG/2ML IJ SOLN
INTRAMUSCULAR | Status: AC
  Filled 2023-06-23: qty 2

## 2023-06-23 MED ORDER — EPHEDRINE SULFATE (PRESSORS) 50 MG/ML IJ SOLN
INTRAMUSCULAR | Status: DC | PRN
Start: 2023-06-23 — End: 2023-06-23
  Administered 2023-06-23 (×3): 5 mg via INTRAVENOUS

## 2023-06-23 MED ORDER — ACETAMINOPHEN 500 MG PO TABS
ORAL_TABLET | ORAL | Status: AC
  Filled 2023-06-23: qty 2

## 2023-06-23 MED ORDER — PROPOFOL 10 MG/ML IV BOLUS
INTRAVENOUS | Status: AC
  Filled 2023-06-23: qty 20

## 2023-06-23 MED ORDER — FENTANYL CITRATE (PF) 100 MCG/2ML IJ SOLN
INTRAMUSCULAR | Status: DC | PRN
  Administered 2023-06-23: 25 ug via INTRAVENOUS

## 2023-06-23 MED ORDER — OXYCODONE HCL 5 MG/5ML PO SOLN: 5.0000 mg | Freq: Once | ORAL | Status: DC | PRN

## 2023-06-23 MED ORDER — CEFAZOLIN SODIUM-DEXTROSE 2-4 GM/100ML-% IV SOLN
INTRAVENOUS | Status: AC
Start: 2023-06-23 — End: ?
  Filled 2023-06-23: qty 100

## 2023-06-23 SURGICAL SUPPLY — 29 items
BENZOIN TINCTURE PRP APPL 2/3 (GAUZE/BANDAGES/DRESSINGS) ×2 IMPLANT
BLADE HEX COATED 2.75 (ELECTRODE) ×2 IMPLANT
BLADE SURG 15 STRL LF DISP TIS (BLADE) ×2 IMPLANT
CANISTER SUCT 1200ML W/VALVE (MISCELLANEOUS) ×2 IMPLANT
CHLORAPREP W/TINT 26 (MISCELLANEOUS) ×2 IMPLANT
COVER BACK TABLE 60X90IN (DRAPES) ×2 IMPLANT
COVER MAYO STAND STRL (DRAPES) ×2 IMPLANT
DRAPE LAPAROTOMY 100X72 PEDS (DRAPES) ×2 IMPLANT
DRAPE UTILITY XL STRL (DRAPES) ×2 IMPLANT
DRSG TEGADERM 4X4.75 (GAUZE/BANDAGES/DRESSINGS) ×2 IMPLANT
ELECTRODE REM PT RTRN 9FT ADLT (ELECTROSURGICAL) ×2 IMPLANT
GLOVE BIO SURGEON STRL SZ7 (GLOVE) ×2 IMPLANT
GLOVE BIOGEL PI IND STRL 7.5 (GLOVE) ×2 IMPLANT
GOWN STRL REUS W/ TWL LRG LVL3 (GOWN DISPOSABLE) ×4 IMPLANT
KIT MARKER MARGIN INK (KITS) IMPLANT
NDL HYPO 25X1 1.5 SAFETY (NEEDLE) ×2 IMPLANT
NEEDLE HYPO 25X1 1.5 SAFETY (NEEDLE) ×1 IMPLANT
NS IRRIG 1000ML POUR BTL (IV SOLUTION) ×2 IMPLANT
PACK BASIN DAY SURGERY FS (CUSTOM PROCEDURE TRAY) ×2 IMPLANT
PENCIL SMOKE EVACUATOR (MISCELLANEOUS) ×2 IMPLANT
SLEEVE SCD COMPRESS KNEE MED (STOCKING) ×2 IMPLANT
SPONGE T-LAP 4X18 ~~LOC~~+RFID (SPONGE) ×2 IMPLANT
STRIP CLOSURE SKIN 1/2X4 (GAUZE/BANDAGES/DRESSINGS) ×2 IMPLANT
SUT MON AB 4-0 PC3 18 (SUTURE) ×2 IMPLANT
SUT VIC AB 3-0 SH 27X BRD (SUTURE) ×2 IMPLANT
SYR CONTROL 10ML LL (SYRINGE) ×2 IMPLANT
TOWEL GREEN STERILE FF (TOWEL DISPOSABLE) ×2 IMPLANT
TUBE CONNECTING 20X1/4 (TUBING) ×2 IMPLANT
YANKAUER SUCT BULB TIP NO VENT (SUCTIONS) ×2 IMPLANT

## 2023-06-23 NOTE — Anesthesia Postprocedure Evaluation (Signed)
 Anesthesia Post Note  Patient: Ariel Johnson  Procedure(s) Performed: MASS EXCISION RIGHT BREAST (Right: Breast)     Patient location during evaluation: PACU Anesthesia Type: General Level of consciousness: awake and alert Pain management: pain level controlled Vital Signs Assessment: post-procedure vital signs reviewed and stable Respiratory status: spontaneous breathing, nonlabored ventilation and respiratory function stable Cardiovascular status: blood pressure returned to baseline Postop Assessment: no apparent nausea or vomiting Anesthetic complications: no   No notable events documented.  Last Vitals:  Vitals:   06/23/23 1440 06/23/23 1508  BP: 122/60 138/70  Pulse: 76 73  Resp: 15 20  Temp:  (!) 36.3 C  SpO2: 96% 97%    Last Pain:  Vitals:   06/23/23 1508  TempSrc: Tympanic  PainSc: 0-No pain                 Rayfield Cairo

## 2023-06-23 NOTE — Op Note (Signed)
 Pre-op diagnosis:  Right breast mass/ personal history of bilateral breast cancer Post-op diagnosis:  Same Procedure:  Right breast excisional biopsy Surgeon:  Rella Cardinal Anesthesia;  Gen- LMA Indications:  68 female with history of left breast DCIS s/p mastectomy with TRAM reconstruction; history of right breast cancer s/p lumpectomy/ SLNB with radiation.  She presents with recent development of an area of mild erythema and a palpable mass just above her right lumpectomy incision.  US  showed an area of fat necrosis with an oil cyst.  She presents now for excision for definitive management and diagnosis.  Description of procedure: The patient is brought to the operating room placed in supine position on the operating room table. After an adequate level of general anesthesia was obtained, her right breast was prepped with ChloraPrep and draped in sterile fashion. A timeout was taken to ensure the proper patient and proper procedure. The patient has a small skin tag in the medial right breast that was excised.  I outlined an elliptical incision around the area of erythema and palpable mass.  We made our skin incision after infiltrating with 0.25% Marcaine . Dissection was carried down in the breast tissue with cautery. We excised the entire mass, including what appeared to be a cyst filled with oily fat necrosis. The specimen was removed and was oriented with a paint kit. This was sent for pathologic examination. There is a small area of firmness in the superior margin, so we excised that as well and sent it as a specimen.  We inspected carefully for hemostasis. The wound was thoroughly irrigated. The wound was closed with a deep layer of 3-0 Vicryl and a subcuticular layer of 4-0 Monocryl. Benzoin Steri-Strips were applied. The patient was then extubated and brought to the recovery room in stable condition. All sponge, instrument, and needle counts are correct.  Kari Otto. Eli Grizzle, MD, [facility]  General/ Trauma Surgery  06/23/2023 2:18 PM

## 2023-06-23 NOTE — Discharge Instructions (Addendum)
 [facility] Office Phone Number [phone]  BREAST BIOPSY/ PARTIAL MASTECTOMY: POST OP INSTRUCTIONS  Always review your discharge instruction sheet given to you by the facility where your surgery was performed.  IF YOU HAVE DISABILITY OR FAMILY LEAVE FORMS, YOU MUST BRING THEM TO THE OFFICE FOR PROCESSING.  DO NOT GIVE THEM TO YOUR DOCTOR.  A prescription for pain medication may be given to you upon discharge.  Take your pain medication as prescribed, if needed.  If narcotic pain medicine is not needed, then you may take acetaminophen  (Tylenol ) or ibuprofen (Advil) as needed. Take your usually prescribed medications unless otherwise directed If you need a refill on your pain medication, please contact your pharmacy.  They will contact our office to request authorization.  Prescriptions will not be filled after 5pm or on week-ends. You should eat very light the first 24 hours after surgery, such as soup, crackers, pudding, etc.  Resume your normal diet the day after surgery. Most patients will experience some swelling and bruising in the breast.  Ice packs and a good support bra will help.  Swelling and bruising can take several days to resolve.  It is common to experience some constipation if taking pain medication after surgery.  Increasing fluid intake and taking a stool softener will usually help or prevent this problem from occurring.  A mild laxative (Milk of Magnesia or Miralax) should be taken according to package directions if there are no bowel movements after 48 hours. Unless discharge instructions indicate otherwise, you may remove your bandages 24-48 hours after surgery, and you may shower at that time.  You may have steri-strips (small skin tapes) in place directly over the incision.  These strips should be left on the skin for 7-10 days.  If your surgeon used skin glue on the incision, you may shower in 24 hours.  The glue will flake off over the next 2-3 weeks.  Any  sutures or staples will be removed at the office during your follow-up visit. ACTIVITIES:  You may resume regular daily activities (gradually increasing) beginning the next day.  Wearing a good support bra or sports bra minimizes pain and swelling.  You may have sexual intercourse when it is comfortable. You may drive when you no longer are taking prescription pain medication, you can comfortably wear a seatbelt, and you can safely maneuver your car and apply brakes. RETURN TO WORK:  ______________________________________________________________________________________ Ariel Johnson should see your doctor in the office for a follow-up appointment approximately two weeks after your surgery.  Your doctor's nurse will typically make your follow-up appointment when she calls you with your pathology report.  Expect your pathology report 2-3 business days after your surgery.  You may call to check if you do not hear from us  after three days. OTHER INSTRUCTIONS: _______________________________________________________________________________________________ _____________________________________________________________________________________________________________________________________ _____________________________________________________________________________________________________________________________________ _____________________________________________________________________________________________________________________________________  WHEN TO CALL YOUR DOCTOR: Fever over 101.0 Nausea and/or vomiting. Extreme swelling or bruising. Continued bleeding from incision. Increased pain, redness, or drainage from the incision.  The clinic staff is available to answer your questions during regular business hours.  Please don't hesitate to call and ask to speak to one of the nurses for clinical concerns.  If you have a medical emergency, go to the nearest emergency room or call 911.  A surgeon from [facility] is always on call at the hospital.  For further questions, please visit [email or web address]  No tylenol  until 6:45pm today, if needed

## 2023-06-23 NOTE — Anesthesia Procedure Notes (Signed)
 Procedure Name: LMA Insertion Date/Time: 06/23/2023 1:41 PM  Performed by: Noralyn Beams, CRNAPre-anesthesia Checklist: Patient identified, Emergency Drugs available, Suction available and Patient being monitored Patient Re-evaluated:Patient Re-evaluated prior to induction Oxygen Delivery Method: Circle system utilized Preoxygenation: Pre-oxygenation with 100% oxygen Induction Type: IV induction Ventilation: Mask ventilation without difficulty LMA: LMA inserted LMA Size: 4.0 Number of attempts: 1 Airway Equipment and Method: Bite block Placement Confirmation: positive ETCO2 Tube secured with: Tape Dental Injury: Teeth and Oropharynx as per pre-operative assessment

## 2023-06-23 NOTE — Interval H&P Note (Signed)
 History and Physical Interval Note:  06/23/2023 12:21 PM  Ariel Johnson  has presented today for surgery, with the diagnosis of RIGHT BREAST MASS.  The various methods of treatment have been discussed with the patient and family. After consideration of risks, benefits and other options for treatment, the patient has consented to  Procedure(s) with comments: EXCISION, CYST, BREAST (Right) - RIGHT BREAST EXCISIONAL BIOPSY as a surgical intervention.  The patient's history has been reviewed, patient examined, no change in status, stable for surgery.  I have reviewed the patient's chart and labs.  Questions were answered to the patient's satisfaction.     Rella Cardinal

## 2023-06-23 NOTE — Transfer of Care (Signed)
 Immediate Anesthesia Transfer of Care Note  Patient: Ariel Johnson  Procedure(s) Performed: MASS EXCISION RIGHT BREAST (Right: Breast)  Patient Location: PACU  Anesthesia Type:General  Level of Consciousness: awake, alert , and oriented  Airway & Oxygen Therapy: Patient Spontanous Breathing and Patient connected to face mask oxygen  Post-op Assessment: Report given to RN and Post -op Vital signs reviewed and stable  Post vital signs: Reviewed and stable  Last Vitals:  Vitals Value Taken Time  BP 135/63 06/23/23 1422  Temp    Pulse 90 06/23/23 1423  Resp 14 06/23/23 1423  SpO2 99 % 06/23/23 1423  Vitals shown include unfiled device data.  Last Pain:  Vitals:   06/23/23 1242  TempSrc: Temporal  PainSc: 0-No pain      Patients Stated Pain Goal: 4 (06/23/23 1242)  Complications: No notable events documented.

## 2023-06-24 ENCOUNTER — Encounter (HOSPITAL_BASED_OUTPATIENT_CLINIC_OR_DEPARTMENT_OTHER): Payer: Self-pay | Admitting: Surgery

## 2023-06-25 LAB — SURGICAL PATHOLOGY

## 2023-06-30 DIAGNOSIS — H43811 Vitreous degeneration, right eye: Secondary | ICD-10-CM | POA: Diagnosis not present

## 2023-06-30 DIAGNOSIS — H25813 Combined forms of age-related cataract, bilateral: Secondary | ICD-10-CM | POA: Diagnosis not present

## 2023-06-30 DIAGNOSIS — H43813 Vitreous degeneration, bilateral: Secondary | ICD-10-CM | POA: Diagnosis not present

## 2023-06-30 DIAGNOSIS — H43812 Vitreous degeneration, left eye: Secondary | ICD-10-CM | POA: Diagnosis not present

## 2023-09-02 ENCOUNTER — Ambulatory Visit: Payer: PPO | Admitting: Hematology and Oncology

## 2023-09-08 DIAGNOSIS — H2513 Age-related nuclear cataract, bilateral: Secondary | ICD-10-CM | POA: Diagnosis not present

## 2023-09-08 DIAGNOSIS — H43813 Vitreous degeneration, bilateral: Secondary | ICD-10-CM | POA: Diagnosis not present

## 2023-09-17 ENCOUNTER — Inpatient Hospital Stay: Attending: Hematology and Oncology | Admitting: Hematology and Oncology

## 2023-09-17 VITALS — BP 121/60 | HR 75 | Temp 98.0°F | Resp 16 | Ht 64.0 in | Wt 144.6 lb

## 2023-09-17 DIAGNOSIS — M8580 Other specified disorders of bone density and structure, unspecified site: Secondary | ICD-10-CM | POA: Insufficient documentation

## 2023-09-17 DIAGNOSIS — Z79811 Long term (current) use of aromatase inhibitors: Secondary | ICD-10-CM | POA: Insufficient documentation

## 2023-09-17 DIAGNOSIS — Z1721 Progesterone receptor positive status: Secondary | ICD-10-CM | POA: Diagnosis not present

## 2023-09-17 DIAGNOSIS — Z170 Estrogen receptor positive status [ER+]: Secondary | ICD-10-CM | POA: Insufficient documentation

## 2023-09-17 DIAGNOSIS — Z1732 Human epidermal growth factor receptor 2 negative status: Secondary | ICD-10-CM | POA: Insufficient documentation

## 2023-09-17 DIAGNOSIS — C50211 Malignant neoplasm of upper-inner quadrant of right female breast: Secondary | ICD-10-CM | POA: Insufficient documentation

## 2023-09-17 DIAGNOSIS — Z9012 Acquired absence of left breast and nipple: Secondary | ICD-10-CM | POA: Insufficient documentation

## 2023-09-17 NOTE — Progress Notes (Signed)
 Patient Care Team: Avva, Ravisankar, MD as PCP - General (Internal Medicine) Odean Potts, MD as Consulting Physician (Hematology and Oncology) Izell Domino, MD as Attending Physician (Radiation Oncology) Belinda Cough, MD as Consulting Physician (General Surgery)  DIAGNOSIS:  Encounter Diagnosis  Name Primary?   Malignant neoplasm of upper-inner quadrant of right breast in female, estrogen receptor positive (HCC) Yes    SUMMARY OF ONCOLOGIC HISTORY: Oncology History  Malignant neoplasm of upper-inner quadrant of right breast in female, estrogen receptor positive (HCC)  11/13/2018 Initial Diagnosis   Routine screening mammogram detected a right breast mass, 2.6cm, at the 1 o'clock position. Biopsy showed IDC with DCIS, grade 2, HER-2 - (1+), ER+ 95%, PR+ 95%, Ki67 15%.   11/18/2018 Cancer Staging   Staging form: Breast, AJCC 8th Edition - Clinical stage from 11/18/2018: Stage IB (cT2, cN0, cM0, G2, ER+, PR+, HER2-)    11/25/2018 Genetic Testing   Negative genetic testing on the [facility] Breast Cancer STAT panel and the Common Hereditary Cancers panel. The report date is 11/25/2018.  The STAT Breast cancer panel offered by [facility] includes sequencing and rearrangement analysis for the following 9 genes:  ATM, BRCA1, BRCA2, CDH1, CHEK2, PALB2, PTEN, STK11 and TP53.  The Common Hereditary Cancers Panel offered by [facility] includes sequencing and/or deletion duplication testing of the following 48 genes: APC, ATM, AXIN2, BARD1, BMPR1A, BRCA1, BRCA2, BRIP1, CDH1, CDK4, CDKN2A (p14ARF), CDKN2A (p16INK4a), CHEK2, CTNNA1, DICER1, EPCAM (Deletion/duplication testing only), GREM1 (promoter region deletion/duplication testing only), KIT, MEN1, MLH1, MSH2, MSH3, MSH6, MUTYH, NBN, NF1, NHTL1, PALB2, PDGFRA, PMS2, POLD1, POLE, PTEN, RAD50, RAD51C, RAD51D, RNF43, SDHB, SDHC, SDHD, SMAD4, SMARCA4. STK11, TP53, TSC1, TSC2, and VHL.  The following genes were evaluated for sequence changes only: SDHA and  HOXB13 c.251G>A variant only.    12/24/2018 Surgery   Right lumpectomy Jeoffrey) [phone]): IDC with DCIS, 3.1cm, grade 2, clear margins, 3 right axillary lymph nodes negative.  ER 95%, PR 95%, Ki-67 15%, HER-2 negative   12/24/2018 Oncotype testing   Oncotype: score 10, 3% chance of distant recurrence in 9 years with tamoxifen alone.    12/31/2018 Cancer Staging   Staging form: Breast, AJCC 8th Edition - Pathologic stage from 12/31/2018: Stage IA (pT2, pN0, cM0, G2, ER+, PR+, HER2-)    01/22/2019 - 02/18/2019 Radiation Therapy   The patient initially received a dose of 40.05 Gy in 15 fractions to the breast using whole-breast tangent fields. This was delivered using a 3-D conformal technique. The pt received a boost delivering an additional 10 Gy in 5 fractions using a electron boost with electrons. The total dose was 50.05 Gy.    02/2019 - 02/2024 Anti-estrogen oral therapy   Anastrozole      CHIEF COMPLIANT:   HISTORY OF PRESENT ILLNESS:  History of Present Illness Ariel Johnson is a 73 year old female with breast cancer who presents for a follow-up visit regarding her ongoing treatment and management.  She was diagnosed with breast cancer over four and a half years ago and is currently on omestrizol, which she tolerates well, experiencing mild hot flashes. A mammogram and ultrasound in April identified an area of fat necrosis that was removed, with no cancerous or precancerous cells found post-surgery.  She experiences intermittent pain and tenderness in her right breast, particularly with certain movements. This pain has persisted since her lumpectomy and lymph node removal five years ago and is described as tolerable.     ALLERGIES:  is allergic to elemental sulfur  and sulfamethoxazole.  MEDICATIONS:  Current Outpatient Medications  Medication Sig Dispense Refill   anastrozole  (ARIMIDEX ) 1 MG tablet TAKE 1 TABLET BY MOUTH EVERY DAY 90 tablet 3   Calcium  Carbonate-Vit D-Min (CALTRATE 600+D PLUS PO) Take by mouth daily.     DOCUSATE SODIUM PO Take 50 mg by mouth as needed.     famotidine  (PEPCID ) 20 MG tablet Take 1 tablet (20 mg total) by mouth 2 (two) times daily.     GEMTESA 75 MG TABS 75 mg.     Magnesium 250 MG TABS Take by mouth as needed.     Multiple Vitamin (MULTIVITAMIN) tablet Take 1 tablet by mouth daily.     Probiotic Product (PROBIOTIC DAILY PO) Take by mouth.     No current facility-administered medications for this visit.    PHYSICAL EXAMINATION: ECOG PERFORMANCE STATUS: 1 - Symptomatic but completely ambulatory  Vitals:   09/17/23 0856  BP: 121/60  Pulse: 75  Resp: 16  Temp: 98 F (36.7 C)  SpO2: 98%   Filed Weights   09/17/23 0856  Weight: 144 lb 9.6 oz (65.6 kg)    Physical Exam BREAST: Breasts normal on palpation.  (exam performed in the presence of a chaperone)  LABORATORY DATA:  I have reviewed the data as listed    Latest Ref Rng & Units 10/12/2022    1:08 AM 11/18/2018    8:24 AM  CMP  Glucose 70 - 99 mg/dL 865  60   BUN 8 - 23 mg/dL 13  15   Creatinine 9.55 - 1.00 mg/dL 9.13  9.15   Sodium 864 - 145 mmol/L 138  142   Potassium 3.5 - 5.1 mmol/L 3.2  3.6   Chloride 98 - 111 mmol/L 104  105   CO2 22 - 32 mmol/L 21  27   Calcium 8.9 - 10.3 mg/dL 9.0  9.3   Total Protein 6.5 - 8.1 g/dL 6.3  7.0   Total Bilirubin 0.3 - 1.2 mg/dL 0.4  0.7   Alkaline Phos 38 - 126 U/L 74  87   AST 15 - 41 U/L 24  21   ALT 0 - 44 U/L 19  18     Lab Results  Component Value Date   WBC 4.7 10/12/2022   HGB 11.5 (L) 10/12/2022   HCT 35.2 (L) 10/12/2022   MCV 89.8 10/12/2022   PLT 158 10/12/2022   NEUTROABS 2.0 10/12/2022    ASSESSMENT & PLAN:  Malignant neoplasm of upper-inner quadrant of right breast in female, estrogen receptor positive (HCC) 11/13/2018:Routine screening mammogram detected a right breast mass, 2.6cm, at the 1 o'clock position. Biopsy showed IDC with DCIS, grade 2, HER-2 - (1+), ER+ 95%, PR+  95%, Ki67 15%. T2N0 stage Ib Prior history of left mastectomy 1998  Treatment plan: 1. 12/24/2018:Right lumpectomy Jeoffrey): IDC with DCIS, 3.1cm, grade 2, clear margins, 3 right axillary lymph nodes negative.  T2 N0 stage Ib 2. Oncotype: score 10, 3% chance of distant recurrence in 9 years with tamoxifen alone.  3. Adjuvant radiation therapy 01/22/2019 4. Adjuvant antiestrogen therapy with anastrozole  1 mg daily X 7 years --------------------------------------------------------------------------------------------------------------------------- Treatment plan: Adjuvant antiestrogen therapy with anastrozole  started January 15th 2021 Anastrozole  toxicities: Denies any major side effects.  She does have occasional hot flashes which are manageable. Continue antiestrogen therapy for 7 years total.   Breast cancer surveillance: 1.  Mammogram 11/28/2021 right breast: Evolving benign fat necrosis (U/S: 1.1 cm oil cyst ext to skin). Benign breast density category B,  consider skin punch biopsy if concerned 2. breast exam 09/17/2023: Benign 3.  Bone density 11/27/2021: T-score -2: Osteopenia: Recommended calcium and vitamin D   [place]. Return to clinic in 1 year for follow up ------------------------------------- Assessment and Plan Assessment & Plan History of ER-positive right breast cancer, surveillance and extended adjuvant endocrine therapy Nearly five years post-diagnosis, on anastrozole  with minimal side effects. Low recurrence risk. Prefers seven-year therapy, aligning with studies showing slight benefit up to seven years. - Continue anastrozole  therapy for a total of seven years. - Arrange for a blood test to detect microscopic cancer DNA in the bloodstream every six months.  Right breast post-surgical neuropathic pain Intermittent pain due to nerve regeneration into scar tissue post-surgery. Not indicative of cancer recurrence. - Reassured that the  pain is a common post-surgical symptom and not indicative of cancer recurrence.  Osteopenia monitoring in context of aromatase inhibitor therapy Osteopenia monitored due to aromatase inhibitors' impact on bone density. - Continue monitoring bone density.      No orders of the defined types were placed in this encounter.  The patient has a good understanding of the overall plan. she agrees with it. she will call with any problems that may develop before the next visit here. Total time spent: 30 mins including face to face time and time spent for planning, charting and co-ordination of care   Viinay K Makhari Dovidio, MD 09/17/23

## 2023-09-17 NOTE — Assessment & Plan Note (Signed)
 11/13/2018:Routine screening mammogram detected a right breast mass, 2.6cm, at the 1 o'clock position. Biopsy showed IDC with DCIS, grade 2, HER-2 - (1+), ER+ 95%, PR+ 95%, Ki67 15%. T2N0 stage Ib Prior history of left mastectomy 1998  Treatment plan: 1. 12/24/2018:Right lumpectomy Ariel Johnson): IDC with DCIS, 3.1cm, grade 2, clear margins, 3 right axillary lymph nodes negative.  T2 N0 stage Ib 2. Oncotype: score 10, 3% chance of distant recurrence in 9 years with tamoxifen alone.  3. Adjuvant radiation therapy 01/22/2019 4. Adjuvant antiestrogen therapy with anastrozole  1 mg daily x5 to 7 years --------------------------------------------------------------------------------------------------------------------------- Treatment plan: Adjuvant antiestrogen therapy with anastrozole  started January 15th 2021 Anastrozole  toxicities: Denies any major side effects.  She does have occasional hot flashes which are manageable.   Breast cancer surveillance: 1.  Mammogram 11/28/2021 right breast: Evolving benign fat necrosis (U/S: 1.1 cm oil cyst ext to skin). Benign breast density category B, consider skin punch biopsy if concerned 2. breast exam 09/17/2023: Benign 3.  Bone density 11/27/2021: T-score -2: Osteopenia: Recommended calcium and vitamin D   She plans to go to [place] in August Return to clinic in 1 year for follow up

## 2023-09-19 ENCOUNTER — Telehealth: Payer: Self-pay

## 2023-09-19 NOTE — Telephone Encounter (Signed)
 Per md orders entered for Guardant Reveal and all supported documents faxed to [phone]. Faxed confirmation was received.

## 2023-10-07 DIAGNOSIS — C50211 Malignant neoplasm of upper-inner quadrant of right female breast: Secondary | ICD-10-CM | POA: Diagnosis not present

## 2023-10-08 ENCOUNTER — Telehealth: Payer: Self-pay | Admitting: *Deleted

## 2023-10-08 NOTE — Telephone Encounter (Signed)
 Per MD request RN placed call to pt with recent Guardant Reveal results being negative.  Pt educated and verbalized understanding.

## 2023-10-09 ENCOUNTER — Encounter: Payer: Self-pay | Admitting: Hematology and Oncology

## 2023-10-14 DIAGNOSIS — K5904 Chronic idiopathic constipation: Secondary | ICD-10-CM | POA: Diagnosis not present

## 2023-10-14 DIAGNOSIS — K219 Gastro-esophageal reflux disease without esophagitis: Secondary | ICD-10-CM | POA: Diagnosis not present

## 2023-10-14 DIAGNOSIS — Z860100 Personal history of colon polyps, unspecified: Secondary | ICD-10-CM | POA: Diagnosis not present

## 2023-10-14 DIAGNOSIS — Z1211 Encounter for screening for malignant neoplasm of colon: Secondary | ICD-10-CM | POA: Diagnosis not present

## 2023-10-29 DIAGNOSIS — K635 Polyp of colon: Secondary | ICD-10-CM | POA: Diagnosis not present

## 2023-10-29 DIAGNOSIS — Z800 Family history of malignant neoplasm of digestive organs: Secondary | ICD-10-CM | POA: Diagnosis not present

## 2023-10-29 DIAGNOSIS — Z1211 Encounter for screening for malignant neoplasm of colon: Secondary | ICD-10-CM | POA: Diagnosis not present

## 2023-10-29 DIAGNOSIS — Z860100 Personal history of colon polyps, unspecified: Secondary | ICD-10-CM | POA: Diagnosis not present

## 2023-10-29 DIAGNOSIS — Z980 Intestinal bypass and anastomosis status: Secondary | ICD-10-CM | POA: Diagnosis not present

## 2024-01-12 DIAGNOSIS — Z6826 Body mass index (BMI) 26.0-26.9, adult: Secondary | ICD-10-CM | POA: Diagnosis not present

## 2024-01-12 DIAGNOSIS — Z01419 Encounter for gynecological examination (general) (routine) without abnormal findings: Secondary | ICD-10-CM | POA: Diagnosis not present

## 2024-01-12 DIAGNOSIS — N8110 Cystocele, unspecified: Secondary | ICD-10-CM | POA: Diagnosis not present

## 2024-01-12 DIAGNOSIS — N3946 Mixed incontinence: Secondary | ICD-10-CM | POA: Diagnosis not present

## 2024-09-20 ENCOUNTER — Ambulatory Visit: Admitting: Hematology and Oncology
